# Patient Record
Sex: Male | Born: 1950 | Race: Black or African American | Hispanic: No | Marital: Married | Smoking: Never smoker
Health system: Southern US, Community
[De-identification: ages and names within clinical notes are randomized; demographics above are authoritative.]

## PROBLEM LIST (undated history)

## (undated) DIAGNOSIS — I10 Essential (primary) hypertension: Secondary | ICD-10-CM

## (undated) DIAGNOSIS — E119 Type 2 diabetes mellitus without complications: Secondary | ICD-10-CM

---

## 2016-11-26 ENCOUNTER — Encounter (HOSPITAL_BASED_OUTPATIENT_CLINIC_OR_DEPARTMENT_OTHER): Payer: Self-pay

## 2016-11-26 ENCOUNTER — Observation Stay (HOSPITAL_BASED_OUTPATIENT_CLINIC_OR_DEPARTMENT_OTHER)
Admission: EM | Admit: 2016-11-26 | Discharge: 2016-11-28 | Disposition: A | Discharge status: Home / Self Care | Payer: Self-pay | Attending: Internal Medicine | Admitting: Internal Medicine

## 2016-11-26 DIAGNOSIS — E785 Hyperlipidemia, unspecified: Secondary | ICD-10-CM | POA: Insufficient documentation

## 2016-11-26 DIAGNOSIS — E119 Type 2 diabetes mellitus without complications: Principal | ICD-10-CM | POA: Insufficient documentation

## 2016-11-26 DIAGNOSIS — G459 Transient cerebral ischemic attack, unspecified: Secondary | ICD-10-CM | POA: Diagnosis present

## 2016-11-26 DIAGNOSIS — E118 Type 2 diabetes mellitus with unspecified complications: Secondary | ICD-10-CM

## 2016-11-26 DIAGNOSIS — G450 Vertebro-basilar artery syndrome: Secondary | ICD-10-CM | POA: Insufficient documentation

## 2016-11-26 DIAGNOSIS — Z6824 Body mass index (BMI) 24.0-24.9, adult: Secondary | ICD-10-CM | POA: Insufficient documentation

## 2016-11-26 DIAGNOSIS — E663 Overweight: Secondary | ICD-10-CM | POA: Insufficient documentation

## 2016-11-26 DIAGNOSIS — R2689 Other abnormalities of gait and mobility: Secondary | ICD-10-CM | POA: Insufficient documentation

## 2016-11-26 DIAGNOSIS — I10 Essential (primary) hypertension: Secondary | ICD-10-CM | POA: Insufficient documentation

## 2016-11-26 HISTORY — DX: Type 2 diabetes mellitus without complications: E11.9

## 2016-11-26 HISTORY — DX: Essential (primary) hypertension: I10

## 2016-11-26 MED ORDER — ONDANSETRON 8 MG PO TBDP
8.0000 mg | ORAL_TABLET | Freq: Once | ORAL | Status: AC
  Administered 2016-11-26: 8 mg via ORAL
  Filled 2016-11-26: qty 1

## 2016-11-26 NOTE — ED Provider Notes (Signed)
[facility] Provider Note   CSN: [id number] Arrival date & time: 11/26/16  2044  By signing my name below, I, Vista Minkobert Ross, attest that this documentation has been prepared under the direction and in the presence of Zadie RhineWickline, Sharlyne Koeneman, MD. Electronically signed, Vista Minkobert Ross, ED Scribe. 11/26/16. 12:17 AM.  History   Chief Complaint Chief Complaint  Patient presents with  . Emesis    HPI HPI Comments:  Steven [name] is a 66 y.o. male, with Hx of DM, HTN, who presents to the Emergency Department with complaints of weakness and difficulty balancing that started this morning around 0500. This morning, pt started having difficulty balancing and seemed weak on his right side. Pt has not been able to walk normally since this morning. Per son: pt has had trouble balancing on his right side and had a near fall this evening due to this. Also per son, pt has had 3 episodes of vomiting today. Pt further reports an intermittent headache that is brought on during ambulation. He has not taken any medications prior to arrival. Pt has chronic weakness in his left leg but no other acute weakness.  Pt speaks [place]. A professional language interpreter was used.  The history is provided by the patient. A language interpreter was used [place]).    Past Medical History:  Diagnosis Date  . Diabetes mellitus without complication (HCC)   . Hypertension     There are no active problems to display for this patient.   History reviewed. No pertinent surgical history.   Home Medications    Prior to Admission medications   Not on File    Family History No family history on file.  Social History Social History  Substance Use Topics  . Smoking status: Never Smoker  . Smokeless tobacco: Never Used  . Alcohol use No    Allergies   Patient has no known allergies.   Review of Systems Review of Systems  Cardiovascular: Negative for chest pain.  Gastrointestinal: Positive for  vomiting.  Musculoskeletal: Positive for gait problem (difficulty balancing).  Neurological: Positive for dizziness and weakness (right sided). Negative for headaches.  All other systems reviewed and are negative.    Physical Exam Updated Vital Signs BP (!) 159/91 (BP Location: Left Arm)   Pulse 88   Temp 98.7 F (37.1 C) (Oral)   Resp 18   Ht 5\' 5"  (1.651 m)   Wt 149 lb 11.1 oz (67.9 kg)   SpO2 100%   BMI 24.91 kg/m   Physical Exam CONSTITUTIONAL: Well developed/well nourished HEAD: Normocephalic/atraumatic EYES: EOMI/PERRL, no nystagmus, no ptosis ENMT: Mucous membranes moist NECK: supple no meningeal signs, no bruits CV: S1/S2 noted, no murmurs/rubs/gallops noted LUNGS: Lungs are clear to auscultation bilaterally, no apparent distress ABDOMEN: soft, nontender, no rebound or guarding GU:no cva tenderness NEURO:Awake/alert, face symmetric, no arm or leg drift is noted Equal 5/5 strength with shoulder abduction, elbow flex/extension, wrist flex/extension in upper extremities  Equal 5/5 strength with hip flexion,knee flex/extension, foot dorsi/plantar flexion Gait normal without ataxia No past pointing EXTREMITIES: pulses normal, full ROM SKIN: warm, color normal PSYCH: no abnormalities of mood noted   ED Treatments / Results  DIAGNOSTIC STUDIES: Oxygen Saturation is 100% on RA, normal by my interpretation.  COORDINATION OF CARE: 12:09 AM-Discussed treatment plan with pt at bedside and pt agreed to plan.   Labs (all labs ordered are listed, but only abnormal results are displayed) Labs Reviewed  COMPREHENSIVE METABOLIC PANEL - Abnormal; Notable for the following:  Result Value   Chloride 100 (*)    Glucose, Bld 138 (*)    ALT 11 (*)    All other components within normal limits  CBC WITH DIFFERENTIAL/PLATELET - Abnormal; Notable for the following:    MCV 74.6 (*)    All other components within normal limits  URINALYSIS, ROUTINE W REFLEX MICROSCOPIC -  Abnormal; Notable for the following:    Glucose, UA 100 (*)    Protein, ur 30 (*)    All other components within normal limits  URINALYSIS, MICROSCOPIC (REFLEX) - Abnormal; Notable for the following:    Squamous Epithelial / LPF 0-5 (*)    All other components within normal limits  LIPASE, BLOOD  TROPONIN I  ETHANOL  PROTIME-INR  APTT  RAPID URINE DRUG SCREEN, HOSP PERFORMED  I-STAT CHEM 8, ED    EKG  EKG Interpretation  Date/Time:  Monday November 26 2016 23:24:07 EDT Ventricular Rate:  66 PR Interval:    QRS Duration: 87 QT Interval:  410 QTC Calculation: 430 R Axis:   23 Text Interpretation:  Sinus rhythm Nonspecific ST abnormality Abnormal ekg No previous ECGs available Confirmed by Zadie Rhine ([id number]) on 11/26/2016 11:31:52 PM       Radiology Ct Head Wo Contrast  Result Date: 11/27/2016 CLINICAL DATA:  Vomiting and weakness EXAM: CT HEAD WITHOUT CONTRAST TECHNIQUE: Contiguous axial images were obtained from the base of the skull through the vertex without intravenous contrast. COMPARISON:  None. FINDINGS: Brain: No acute territorial infarction, hemorrhage or intracranial mass is seen. Moderate-to-marked periventricular and subcortical white matter hypodensity most likely small vessel ischemic changes. Mild to moderate atrophy. Ventricles felt within normal limits given atrophy. 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 Vascular: No hyperdense vessels.  Carotid artery calcifications. Skull: No fracture or suspicious bone lesion Sinuses/Orbits: No acute fluid levels. Imaged portions of the orbits are unremarkable Other: None IMPRESSION: 1. No definite CT evidence for acute intracranial abnormality. 2. Moderate to marked small vessel ischemic changes of the white matter. Electronically Signed   By: Jasmine Pang M.D.   On: 11/27/2016 00:49    Procedures Procedures (including critical care time)  Medications Ordered in ED Medications    ondansetron (ZOFRAN-ODT) disintegrating tablet 8 mg (8 mg Oral Given 11/26/16 2336)     Initial Impression / Assessment and Plan / ED Course  I have reviewed the triage vital signs and the nursing notes.  Pertinent labs results that were available during my care of the patient were reviewed by me and considered in my medical decision making (see chart for details).    tPA in stroke considered but not given due to: Onset over 3-4.5hours  2:04 AM Pt here with episodes of HA/dizziness/vomiting and difficutly ambulating Concern for occult cerebellar infarct or VBI Will admit for TIA workup Pt agreeable D/w dr Clyde [facility]borg at [facility] for admission Defer neuro consult for now History is difficult due to language barrier  Final Clinical Impressions(s) / ED Diagnoses   Final diagnoses:  Vertebrobasilar artery syndrome    New Prescriptions New Prescriptions   No medications on file  I personally performed the services described in this documentation, which was scribed in my presence. The recorded information has been reviewed and is accurate.       Zadie Rhine, MD 11/27/16 [id number]

## 2016-11-26 NOTE — ED Notes (Addendum)
ED Provider at bedside. Phone interpreter used for the patients language of [place].

## 2016-11-26 NOTE — ED Triage Notes (Addendum)
Per son interpreter-pt was going from the BR to the bed approx 5am-when he had a near fall and vomited-with vomiting x 3 today-constipation x 3 days-NAD-presents to triage in w/c

## 2016-11-27 ENCOUNTER — Observation Stay (HOSPITAL_COMMUNITY): Payer: Self-pay

## 2016-11-27 ENCOUNTER — Emergency Department (HOSPITAL_BASED_OUTPATIENT_CLINIC_OR_DEPARTMENT_OTHER): Payer: Self-pay

## 2016-11-27 ENCOUNTER — Encounter (HOSPITAL_BASED_OUTPATIENT_CLINIC_OR_DEPARTMENT_OTHER): Payer: Self-pay | Admitting: *Deleted

## 2016-11-27 ENCOUNTER — Ambulatory Visit (HOSPITAL_BASED_OUTPATIENT_CLINIC_OR_DEPARTMENT_OTHER): Payer: Self-pay

## 2016-11-27 ENCOUNTER — Other Ambulatory Visit (HOSPITAL_COMMUNITY): Payer: Self-pay

## 2016-11-27 DIAGNOSIS — E118 Type 2 diabetes mellitus with unspecified complications: Secondary | ICD-10-CM

## 2016-11-27 DIAGNOSIS — G459 Transient cerebral ischemic attack, unspecified: Secondary | ICD-10-CM | POA: Diagnosis present

## 2016-11-27 DIAGNOSIS — I10 Essential (primary) hypertension: Secondary | ICD-10-CM

## 2016-11-27 DIAGNOSIS — E119 Type 2 diabetes mellitus without complications: Secondary | ICD-10-CM

## 2016-11-27 DIAGNOSIS — G450 Vertebro-basilar artery syndrome: Secondary | ICD-10-CM

## 2016-11-27 LAB — COMPREHENSIVE METABOLIC PANEL
ALT: 11 U/L — ABNORMAL LOW (ref 17–63)
AST: 16 U/L (ref 15–41)
Albumin: 4.6 g/dL (ref 3.5–5.0)
Alkaline Phosphatase: 67 U/L (ref 38–126)
Anion gap: 9 (ref 5–15)
BILIRUBIN TOTAL: 0.6 mg/dL (ref 0.3–1.2)
BUN: 16 mg/dL (ref 6–20)
CO2: 30 mmol/L (ref 22–32)
CREATININE: 0.99 mg/dL (ref 0.61–1.24)
Calcium: 9.8 mg/dL (ref 8.9–10.3)
Chloride: 100 mmol/L — ABNORMAL LOW (ref 101–111)
Glucose, Bld: 138 mg/dL — ABNORMAL HIGH (ref 65–99)
POTASSIUM: 3.7 mmol/L (ref 3.5–5.1)
Sodium: 139 mmol/L (ref 135–145)
TOTAL PROTEIN: 7.7 g/dL (ref 6.5–8.1)

## 2016-11-27 LAB — GLUCOSE, CAPILLARY
GLUCOSE-CAPILLARY: 138 mg/dL — AB (ref 65–99)
Glucose-Capillary: 110 mg/dL — ABNORMAL HIGH (ref 65–99)
Glucose-Capillary: 139 mg/dL — ABNORMAL HIGH (ref 65–99)
Glucose-Capillary: 205 mg/dL — ABNORMAL HIGH (ref 65–99)

## 2016-11-27 LAB — RAPID URINE DRUG SCREEN, HOSP PERFORMED
Amphetamines: NOT DETECTED
BARBITURATES: NOT DETECTED
BENZODIAZEPINES: NOT DETECTED
COCAINE: NOT DETECTED
Opiates: NOT DETECTED
TETRAHYDROCANNABINOL: NOT DETECTED

## 2016-11-27 LAB — URINALYSIS, ROUTINE W REFLEX MICROSCOPIC
Bilirubin Urine: NEGATIVE
GLUCOSE, UA: 100 mg/dL — AB
HGB URINE DIPSTICK: NEGATIVE
KETONES UR: NEGATIVE mg/dL
LEUKOCYTES UA: NEGATIVE
Nitrite: NEGATIVE
PROTEIN: 30 mg/dL — AB
Specific Gravity, Urine: 1.02 (ref 1.005–1.030)
pH: 6 (ref 5.0–8.0)

## 2016-11-27 LAB — CBC WITH DIFFERENTIAL/PLATELET
BASOS ABS: 0 10*3/uL (ref 0.0–0.1)
Basophils Relative: 0 %
EOS PCT: 0 %
Eosinophils Absolute: 0 10*3/uL (ref 0.0–0.7)
HEMATOCRIT: 39.7 % (ref 39.0–52.0)
HEMOGLOBIN: 13.9 g/dL (ref 13.0–17.0)
LYMPHS ABS: 1.5 10*3/uL (ref 0.7–4.0)
LYMPHS PCT: 18 %
MCH: 26.1 pg (ref 26.0–34.0)
MCHC: 35 g/dL (ref 30.0–36.0)
MCV: 74.6 fL — ABNORMAL LOW (ref 78.0–100.0)
MONOS PCT: 5 %
Monocytes Absolute: 0.4 10*3/uL (ref 0.1–1.0)
NEUTROS ABS: 6.5 10*3/uL (ref 1.7–7.7)
Neutrophils Relative %: 77 %
Platelets: 278 10*3/uL (ref 150–400)
RBC: 5.32 MIL/uL (ref 4.22–5.81)
RDW: 13.3 % (ref 11.5–15.5)
WBC: 8.4 10*3/uL (ref 4.0–10.5)

## 2016-11-27 LAB — ETHANOL: Alcohol, Ethyl (B): <5 mg/dL (ref <5)

## 2016-11-27 LAB — LIPASE, BLOOD: LIPASE: 30 U/L (ref 11–51)

## 2016-11-27 LAB — TROPONIN I: Troponin I: <0.03 ng/mL (ref <0.03)

## 2016-11-27 LAB — URINALYSIS, MICROSCOPIC (REFLEX): Bacteria, UA: NONE SEEN

## 2016-11-27 LAB — PROTIME-INR
INR: 0.98
Prothrombin Time: 13 seconds (ref 11.4–15.2)

## 2016-11-27 LAB — APTT: aPTT: 28 seconds (ref 24–36)

## 2016-11-27 MED ORDER — INSULIN ASPART 100 UNIT/ML ~~LOC~~ SOLN
0.0000 [IU] | Freq: Every day | SUBCUTANEOUS | Status: DC
  Administered 2016-11-27: 2 [IU] via SUBCUTANEOUS

## 2016-11-27 MED ORDER — ASPIRIN 325 MG PO TABS
325.0000 mg | ORAL_TABLET | Freq: Every day | ORAL | Status: DC
  Administered 2016-11-27 – 2016-11-28 (×2): 325 mg via ORAL
  Filled 2016-11-27 (×2): qty 1

## 2016-11-27 MED ORDER — ONDANSETRON HCL 4 MG/2ML IJ SOLN: 4.0000 mg | Freq: Three times a day (TID) | INTRAMUSCULAR | Status: DC | PRN

## 2016-11-27 MED ORDER — SODIUM CHLORIDE 0.9 % IV SOLN: INTRAVENOUS | Status: DC

## 2016-11-27 MED ORDER — ACETAMINOPHEN 650 MG RE SUPP: 650.0000 mg | RECTAL | Status: DC | PRN

## 2016-11-27 MED ORDER — HYDRALAZINE HCL 20 MG/ML IJ SOLN: 5.0000 mg | Freq: Three times a day (TID) | INTRAMUSCULAR | Status: DC | PRN

## 2016-11-27 MED ORDER — ACETAMINOPHEN 325 MG PO TABS: 650.0000 mg | ORAL_TABLET | ORAL | Status: DC | PRN

## 2016-11-27 MED ORDER — STROKE: EARLY STAGES OF RECOVERY BOOK
Freq: Once | Status: DC
  Filled 2016-11-27: qty 1

## 2016-11-27 MED ORDER — ENOXAPARIN SODIUM 40 MG/0.4ML ~~LOC~~ SOLN
40.0000 mg | SUBCUTANEOUS | Status: DC
  Administered 2016-11-27 – 2016-11-28 (×2): 40 mg via SUBCUTANEOUS
  Filled 2016-11-27 (×2): qty 0.4

## 2016-11-27 MED ORDER — ASPIRIN 300 MG RE SUPP
300.0000 mg | Freq: Every day | RECTAL | Status: DC
  Filled 2016-11-27: qty 1

## 2016-11-27 MED ORDER — INSULIN ASPART 100 UNIT/ML ~~LOC~~ SOLN
0.0000 [IU] | Freq: Three times a day (TID) | SUBCUTANEOUS | Status: DC
  Administered 2016-11-27 (×2): 1 [IU] via SUBCUTANEOUS
  Administered 2016-11-28: 2 [IU] via SUBCUTANEOUS

## 2016-11-27 MED ORDER — SENNOSIDES-DOCUSATE SODIUM 8.6-50 MG PO TABS: 1.0000 | ORAL_TABLET | Freq: Every evening | ORAL | Status: DC | PRN

## 2016-11-27 MED ORDER — ACETAMINOPHEN 160 MG/5ML PO SOLN: 650.0000 mg | ORAL | Status: DC | PRN

## 2016-11-27 NOTE — Progress Notes (Signed)
*  PRELIMINARY RESULTS* Vascular Ultrasound Carotid Duplex (Doppler) has been completed.   Findings suggest 1-39% internal carotid artery stenosis bilaterally. Vertebral arteries are patent with antegrade flow.  11/27/2016 11:56 AM Gertie FeyMichelle Ruford Dudzinski, BS, RVT, RDCS, RDMS

## 2016-11-27 NOTE — Evaluation (Signed)
Physical Therapy Evaluation Patient Details Name: Steven [name] MRN: [id number] DOB: 06-Aug-1950 Today's Date: 11/27/2016   History of Present Illness  66 year old male with past medical history of diabetes mellitus and hypertension, who presents with poor balance, dizziness, nearly fall, nausea, vomiting 3.  CT negative, MRI pending.  Clinical Impression  Pt is close to baseline functioning with pt noticing some L LE sluggishness, but mobility/gait is functional and pt should be safe at home with family assist. There are no further acute PT needs.  Will sign off at this time.     Follow Up Recommendations No PT follow up    Equipment Recommendations  None recommended by PT    Recommendations for Other Services       Precautions / Restrictions Precautions Precautions:  (minimal fall risk)      Mobility  Bed Mobility Overal bed mobility: Modified Independent                Transfers Overall transfer level: Needs assistance   Transfers: Sit to/from Stand Sit to Stand: Supervision            Ambulation/Gait Ambulation/Gait assistance: Supervision Ambulation Distance (Feet): 450 Feet Assistive device: None Gait Pattern/deviations: Step-through pattern   Gait velocity interpretation: at or above normal speed for age/gender General Gait Details: steady, mild circumduction L LE when finally fatigued, but nothing too destabilizing  Stairs Stairs: Yes Stairs assistance: Supervision Stair Management: One rail Left;Alternating pattern;Forwards Number of Stairs: 6 General stair comments: safe with rail  Wheelchair Mobility    Modified Rankin (Stroke Patients Only) Modified Rankin (Stroke Patients Only) Pre-Morbid Rankin Score: No symptoms Modified Rankin: No significant disability     Balance                                 Standardized Balance Assessment Standardized Balance Assessment : Dynamic Gait Index   Dynamic Gait Index Level  Surface: Normal Change in Gait Speed: Normal Gait with Horizontal Head Turns: Normal Gait with Vertical Head Turns: Normal Gait and Pivot Turn: Mild Impairment Step Over Obstacle: Mild Impairment Step Around Obstacles: Normal Steps: Mild Impairment Total Score: 21       Pertinent Vitals/Pain Pain Assessment: No/denies pain    Home Living Family/patient expects to be discharged to:: Private residence Living Arrangements: Spouse/significant other;Children Available Help at Discharge: Family;Available 24 hours/day Type of Home: House Home Access: Level entry     Home Layout: Two level;Bed/bath upstairs Home Equipment: None Additional Comments: Independent in the home    Prior Function Level of Independence: Independent               Hand Dominance        Extremity/Trunk Assessment   Upper Extremity Assessment Upper Extremity Assessment:  (no focal weakness)    Lower Extremity Assessment Lower Extremity Assessment: Overall WFL for tasks assessed       Communication   Communication: No difficulties  Cognition Arousal/Alertness: Awake/alert Behavior During Therapy: WFL for tasks assessed/performed Overall Cognitive Status: Within Functional Limits for tasks assessed                                        General Comments      Exercises     Assessment/Plan    PT Assessment Patent does not need any further PT services  PT  Problem List         PT Treatment Interventions      PT Goals (Current goals can be found in the Care Plan section)  Acute Rehab PT Goals PT Goal Formulation: All assessment and education complete, DC therapy    Frequency     Barriers to discharge        Co-evaluation               AM-PAC PT "6 Clicks" Daily Activity  Outcome Measure Difficulty turning over in bed (including adjusting bedclothes, sheets and blankets)?: None Difficulty moving from lying on back to sitting on the side of the bed? :  A Little Difficulty sitting down on and standing up from a chair with arms (e.g., wheelchair, bedside commode, etc,.)?: A Little Help needed moving to and from a bed to chair (including a wheelchair)?: A Little Help needed walking in hospital room?: A Little Help needed climbing 3-5 steps with a railing? : A Little 6 Click Score: 19    End of Session   Activity Tolerance: Patient tolerated treatment well Patient left: Other (comment) (in transport chair on way to MRI) Nurse Communication: Mobility status PT Visit Diagnosis: Unsteadiness on feet (R26.81)    Time: [id number] PT Time Calculation (min) (ACUTE ONLY): 25 min   Charges:   PT Evaluation $PT Eval Low Complexity: 1 Procedure PT Treatments $Gait Training: 8-22 mins   PT G Codes:   PT G-Codes **NOT FOR INPATIENT CLASS** Functional Assessment Tool Used: AM-PAC 6 Clicks Basic Mobility;Clinical judgement Functional Limitation: Mobility: Walking and moving around Mobility: Walking and Moving Around Current Status ([id number](G8978): 0 percent impaired, limited or restricted Mobility: Walking and Moving Around Goal Status (Q6578(G8979): 0 percent impaired, limited or restricted Mobility: Walking and Moving Around Discharge Status [phone](G8980): 0 percent impaired, limited or restricted    11/27/2016  Dalton BingKen Marqui Formby, PT [phone]  (pager)  Steven [name] 11/27/2016, 10:44 AM

## 2016-11-27 NOTE — Care Management Note (Signed)
Case Management Note  Patient Details  Name: Steven [name] MRN: [id number] Date of Birth: [birth year]  Subjective/Objective:    Pt in to r/o CVA. He is from [place] but currently living with family in [place].  Pt without insurance and PCP. Pt has seen Dr Mcarthur Rossetti a few times but patient struggles with cost of MD appointments.             Action/Plan: No f/u per PT/OT and no DME needs. CM met with the patient and his family and provided them information on the [facility]. They were interested. CM was able to obtain him an appointment at the [facility] clinic doing overflow for [facility]. CM informed the family about the use of the pharmacy at [facility] for assistance with his medications.   Expected Discharge Date:                  Expected Discharge Plan:  Home/Self Care  In-House Referral:     Discharge planning Services  CM Consult  Post Acute Care Choice:    Choice offered to:     DME Arranged:    DME Agency:     HH Arranged:    HH Agency:     Status of Service:  In process, will continue to follow  If discussed at Long Length of Stay Meetings, dates discussed:    Additional Comments:  Pollie Friar, RN 11/27/2016, 2:21 PM

## 2016-11-27 NOTE — Progress Notes (Addendum)
This is a no charge note  [facility] Bebe ShaggyWickline   66 year old male with past medical history of diabetes mellitus and hypertension, who presents with poor balance, dizziness, nearly fall, nausea, vomiting 3. Symptoms started at abut 5:00 AM. Per EDP, no obvious foacl neurologic findings on physical examination except abnormal finger to nose test. CT head is negative for acute intracranial abnormalities. The patient was found to have WBC 8.4, negative troponin, INR 0.98, PTT 28, electrolytes renal function okay, temperature normal, oxygen saturation 99% on room air. Pt is accepted to tele bed for obs. Per EDP, concerning for TIA, occult cerebellar infarct or VBI. TIA admission order was put in. Neuro was not called yet.  Pt speaks [place] language.  Please call manager or [facility] at [phone] when pt arrives to floor  Lorretta HarpXilin Shamanda Len, MD  [facility] Pager [phone]  If 7PM-7AM, please contact night-coverage [email or web address] Password [email or web address] 11/27/2016, 2:17 AM

## 2016-11-27 NOTE — Consult Note (Signed)
Requesting Physician: Dr. Evelena Peat    Chief Complaint: transient gait instability  History obtained from:  Patient, Wife and chart, interpreter was not available   HPI:                                                                                                                                         Steven [name] is an 66 y.o. male with hx as below is presenting with some questionable gait instability, leaning towards the right when walking. He apparently had a near fall, followed by some nausea and vomiting as well. Currently back to baseline with no complaints.    Past Medical History:  Diagnosis Date  . Diabetes mellitus without complication (HCC)   . Hypertension     History reviewed. No pertinent surgical history.  History reviewed. No pertinent family history. Social History:  reports that he has never smoked. He has never used smokeless tobacco. He reports that he does not drink alcohol or use drugs.  Allergies: No Known Allergies  Medications:                                                                                                                           I have reviewed the patient's current medications.  ROS:                                                                                                                                       History obtained from the patient  General ROS: negative for - chills, fatigue, fever, night sweats, weight gain or weight loss Psychological ROS: negative for - behavioral disorder, hallucinations, memory   Neurologic Examination:  Blood pressure 130/78, pulse 65, temperature 98.8 F (37.1 C), temperature source Oral, resp. rate 16, height 5\' 5"  (1.651 m), weight 67.9 kg (149 lb 9.6 oz), SpO2 100 %.  HEENT-  Normocephalic, no lesions, without obvious abnormality.  Normal external eye and conjunctiva.  Normal TM's  bilaterally.  Normal auditory canals and external ears. Normal external nose, mucus membranes and septum.  Normal pharynx. Cardiovascular- regular rate and rhythm, S1, S2 normal, no murmur, click, rub or gallop, pulses palpable throughout   Lungs- chest clear, no wheezing, rales, normal symmetric air entry, Heart exam - S1, S2 normal, no murmur, no gallop, rate regular Abdomen- soft, non-tender; bowel sounds normal; no masses,  no organomegaly Extremities- no edema and no cyanosis   Neurological Examination Mental Status: Alert, oriented, thought content appropriate.  Speech fluent without evidence of aphasia.  Able to follow 3 step commands without difficulty. Cranial Nerves: II: Discs flat bilaterally; Visual fields grossly normal,  III,IV, VI: ptosis not present, extra-ocular motions intact bilaterally, pupils equal, round, reactive to light and accommodation V,VII: smile symmetric, facial light touch sensation normal bilaterally VIII: hearing normal bilaterally IX,X: uvula rises symmetrically XI: bilateral shoulder shrug XII: midline tongue extension Motor: Right : Upper extremity   5/5    Left:     Upper extremity   5/5  Lower extremity   5/5     Lower extremity   5/5 Tone and bulk:normal tone throughout; no atrophy noted Sensory: Pinprick and light touch intact throughout, bilaterally Plantars: Right: downgoing   Left: downgoing Cerebellar: normal finger-to-nose, normal rapid alternating movements and normal heel-to-shin test Gait: normal gait and station, negative romberg        Lab Results: Basic Metabolic Panel:  Recent Labs Lab 11/26/16 2335  NA 139  K 3.7  CL 100*  CO2 30  GLUCOSE 138*  BUN 16  CREATININE 0.99  CALCIUM 9.8    Liver Function Tests:  Recent Labs Lab 11/26/16 2335  AST 16  ALT 11*  ALKPHOS 67  BILITOT 0.6  PROT 7.7  ALBUMIN 4.6    Recent Labs Lab 11/26/16 2335  LIPASE 30   No results for input(s): AMMONIA in the last 168  hours.  CBC:  Recent Labs Lab 11/26/16 2335  WBC 8.4  NEUTROABS 6.5  HGB 13.9  HCT 39.7  MCV 74.6*  PLT 278    Cardiac Enzymes:  Recent Labs Lab 11/26/16 2335  TROPONINI <0.03    Lipid Panel: No results for input(s): CHOL, TRIG, HDL, CHOLHDL, VLDL, LDLCALC in the last 168 hours.  CBG:  Recent Labs Lab 11/27/16 0622  GLUCAP 110*    Microbiology: No results found for this or any previous visit.  Coagulation Studies:  Recent Labs  11/27/16 0040  LABPROT 13.0  INR 0.98    Imaging: Ct Head Wo Contrast  Result Date: 11/27/2016 CLINICAL DATA:  Vomiting and weakness EXAM: CT HEAD WITHOUT CONTRAST TECHNIQUE: Contiguous axial images were obtained from the base of the skull through the vertex without intravenous contrast. COMPARISON:  None. FINDINGS: Brain: No acute territorial infarction, hemorrhage or intracranial mass is seen. Moderate-to-marked periventricular and subcortical white matter hypodensity most likely small vessel ischemic changes. Mild to moderate atrophy. Ventricles felt within normal limits given atrophy. 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 Vascular: No hyperdense vessels.  Carotid artery calcifications. Skull: No fracture or suspicious bone lesion Sinuses/Orbits: No acute fluid levels. Imaged portions of the orbits are unremarkable Other: None IMPRESSION: 1.  No definite CT evidence for acute intracranial abnormality. 2. Moderate to marked small vessel ischemic changes of the white matter. Electronically Signed   By: Jasmine Pang M.D.   On: 11/27/2016 00:49         11/27/2016, 9:28 AM   Assessment: 66 y.o. male with hx as below is presenting with some questionable gait instability, leaning towards the right when walking. He apparently had a near fall, followed by some nausea and vomiting as well. Currently back to baseline with no complaints.  Given his stroke/TIA risk factors would consider the below  workup  1. HgbA1c, fasting lipid panel 2. MRI of the brain without contrast, CTA head and neck with contrast 3. PT consult, OT consult, Speech consult 4. Echocardiogram 5. Carotid dopplers 6. Prophylactic therapy-Antiplatelet med: Aspirin - dose 325 7. Risk factor modification 8. Telemetry monitoring 9. Frequent neuro checks 10 NPO until passes stroke swallow screen 11 please page stroke NP  Or  PA  Or MD from 8am -4 pm  as this patient from this time will be  followed by the stroke.   You can look them up on [email or web address]  Password [email or web address]       Stroke Risk Factors - diabetes mellitus and hypertension

## 2016-11-27 NOTE — Evaluation (Addendum)
Occupational Therapy Evaluation/Discharge Patient Details Name: Steven [name] MRN: [id number] DOB: 03/05/1951 Today's Date: 11/27/2016    History of Present Illness 66 year old male with past medical history of diabetes mellitus and hypertension, who presents with poor balance, dizziness, nearly fall, nausea, vomiting 3.  CT negative, MRI pending.   Clinical Impression   Pt demonstrates ability to safely complete ADL in hospital setting with modified independence requiring increased time due to slight instability in dynamic standing but no physical assistance required. He demonstrates vision, cognition, and B UE strength WFL for ADL participation. Pt will have 24 hour assistance at home from wife. Pt/family report no further questions/concerns. No further acute OT needs identified and no OT follow-up recommended. OT will sign off.    Follow Up Recommendations  No OT follow up;Supervision/Assistance - 24 hour    Equipment Recommendations  None recommended by OT    Recommendations for Other Services       Precautions / Restrictions Precautions Precautions:  (minimal fall risk) Restrictions Weight Bearing Restrictions: No      Mobility Bed Mobility Overal bed mobility: Modified Independent                Transfers Overall transfer level: Modified independent   Transfers: Sit to/from Stand Sit to Stand: Supervision         General transfer comment: Increased time with no physical assistance.     Balance Overall balance assessment: Needs assistance Sitting-balance support: No upper extremity supported;Feet supported Sitting balance-Leahy Scale: Good     Standing balance support: No upper extremity supported;During functional activity Standing balance-Leahy Scale: Good Standing balance comment: Some instability with dynamic tasks but no assistance required                 Standardized Balance Assessment Standardized Balance Assessment : Dynamic Gait  Index   Dynamic Gait Index Level Surface: Normal Change in Gait Speed: Normal Gait with Horizontal Head Turns: Normal Gait with Vertical Head Turns: Normal Gait and Pivot Turn: Mild Impairment Step Over Obstacle: Mild Impairment Step Around Obstacles: Normal Steps: Mild Impairment Total Score: 21     ADL either performed or assessed with clinical judgement   ADL Overall ADL's : Modified independent                                       General ADL Comments: Increased time but no physical assistance.      Vision   Vision Assessment?: No apparent visual deficits Additional Comments: Pt able to functionally use vision approrpiately throughout session.      Perception     Praxis Praxis Praxis tested?: Within functional limits    Pertinent Vitals/Pain Pain Assessment: No/denies pain     Hand Dominance     Extremity/Trunk Assessment Upper Extremity Assessment Upper Extremity Assessment: Overall WFL for tasks assessed   Lower Extremity Assessment Lower Extremity Assessment: Overall WFL for tasks assessed       Communication Communication Communication: No difficulties   Cognition Arousal/Alertness: Awake/alert Behavior During Therapy: WFL for tasks assessed/performed Overall Cognitive Status: Within Functional Limits for tasks assessed                                 General Comments: Daughter-in-law reports some memory loss at baseline.    General Comments  Daughter-in-law and wife present and engaged in  session.     Exercises     Shoulder Instructions      Home Living Family/patient expects to be discharged to:: Private residence Living Arrangements: Spouse/significant other;Children Available Help at Discharge: Family;Available 24 hours/day Type of Home: House Home Access: Level entry     Home Layout: Two level;Bed/bath upstairs Alternate Level Stairs-Number of Steps: flight Alternate Level Stairs-Rails:  Right;Left Bathroom Shower/Tub: Chief Strategy Officer: Standard     Home Equipment: None   Additional Comments: Independent in the home      Prior Functioning/Environment Level of Independence: Independent                 OT Problem List: Decreased activity tolerance;Impaired balance (sitting and/or standing)      OT Treatment/Interventions:      OT Goals(Current goals can be found in the care plan section) Acute Rehab OT Goals Patient Stated Goal: to go home OT Goal Formulation: With patient/family Time For Goal Achievement: 12/11/16 Potential to Achieve Goals: Good  OT Frequency:     Barriers to D/C:            Co-evaluation              AM-PAC PT "6 Clicks" Daily Activity     Outcome Measure Help from another person eating meals?: None Help from another person taking care of personal grooming?: None Help from another person toileting, which includes using toliet, bedpan, or urinal?: None Help from another person bathing (including washing, rinsing, drying)?: None Help from another person to put on and taking off regular upper body clothing?: None Help from another person to put on and taking off regular lower body clothing?: None 6 Click Score: 24   End of Session    Activity Tolerance: Patient tolerated treatment well Patient left: in bed;with call bell/phone within reach;with family/visitor present  OT Visit Diagnosis: Other abnormalities of gait and mobility (R26.89)                Time: 1212-1228 OT Time Calculation (min): 16 min Charges:  OT General Charges $OT Visit: 1 Procedure OT Evaluation $OT Eval Low Complexity: 1 Procedure G-Codes: OT G-codes **NOT FOR INPATIENT CLASS** Functional Assessment Tool Used: AM-PAC 6 Clicks Daily Activity Functional Limitation: Self care Self Care Current Status (Z6109): 0 percent impaired, limited or restricted Self Care Goal Status (U0454): 0 percent impaired, limited or restricted Self  Care Discharge Status (U9811): 0 percent impaired, limited or restricted   Doristine Section, MS OTR/L  Pager: [phone]   Steven [name] Steven [name] 11/27/2016, 1:43 PM

## 2016-11-27 NOTE — ED Notes (Signed)
Patient transported to CT 

## 2016-11-27 NOTE — H&P (Signed)
History and Physical    Steven [name] ZOX:[id number] DOB: 14-Dec-1950 DOA: 11/26/2016   PCP: No primary care provider on file.   Patient coming from:  Home    Chief Complaint: Vomiting and gait instability   HPI: Steven [name] is a 66 y.o. male [place] speaker with medical history significant for DM, HTN, presenting with gait instability, "leaning to he right" with a near episode of fall around 5 am, nausea with 3 episodes of non bloody emesis without recurrence. Denied falls, seizures, confusion, vision changes. Denies fevers, chills, night sweats, insect bites, recent trips.  Reports intermittent frontal headache. Denies any respiratory complaints. Denies any chest pain or palpitations. Denies lower extremity swelling. No urine or bowel incontinence or retention,  Appetite is normal. No dysphagia.  Denies any dysuria. Denies abnormal skin rashes Denies any bleeding issues such as epistaxis, hematemesis, hematuria or hematochezia.  No ETOH, tobacco or recreational drug use  . ED Course:  BP 130/78 (BP Location: Right Arm)   Pulse 65   Temp 98.8 F (37.1 C) (Oral)   Resp 16   Ht 5\' 5"  (1.651 m)   Wt 67.9 kg (149 lb 9.6 oz)   SpO2 100%   BMI 24.89 kg/m    CT head negative WBC 8.4 TN neg INR 0.98 CMET normal  MRI  Head pending  EKG SR   Review of Systems:  As per HPI otherwise all other systems reviewed and are negative  Past Medical History:  Diagnosis Date  . Diabetes mellitus without complication (HCC)   . Hypertension     History reviewed. No pertinent surgical history.  Social History Social History   Social History  . Marital status: Married    Spouse name: N/A  . Number of children: N/A  . Years of education: N/A   Occupational History  . Not on file.   Social History Main Topics  . Smoking status: Never Smoker  . Smokeless tobacco: Never Used  . Alcohol use No  . Drug use: No  . Sexual activity: Not on file   Other Topics Concern  . Not on file    Social History Narrative  . No narrative on file     No Known Allergies  History reviewed. No pertinent family history.    Prior to Admission medications   Not on File    Physical Exam:  Vitals:   11/27/16 0345 11/27/16 0400 11/27/16 0503 11/27/16 0659  BP:  127/73 (!) 155/79 130/78  Pulse: 66 68 65   Resp:  17 16 16   Temp:   98.4 F (36.9 C) 98.8 F (37.1 C)  TempSrc:   Oral Oral  SpO2: 100% 100% 100%   Weight:   67.9 kg (149 lb 9.6 oz)   Height:   5\' 5"  (1.651 m)    Constitutional: NAD, calm, comfortable  Eyes: PERRL, lids and conjunctivae normal ENMT: Mucous membranes are moist, without exudate or lesions  Neck: normal, supple, no masses, no thyromegaly Respiratory: clear to auscultation bilaterally, no wheezing, no crackles. Normal respiratory effort  Cardiovascular: Regular rate and rhythm, no murmurs, rubs or gallops. No extremity edema. 2+ pedal pulses. No carotid bruits.  Abdomen: Soft, non tender, No hepatosplenomegaly. Bowel sounds positive.  Musculoskeletal: no clubbing / cyanosis. Moves all extremities Skin: no jaundice, No lesions.  Neurologic: Sensation intact  Strength equal in all extremities. Mild finger to nose R discordination. No  Nistagmus. Gait not tested but reports leaning to the right.  Psychiatric:   Alert  and oriented x 3. Normal mood.     Labs on Admission: I have personally reviewed following labs and imaging studies  CBC:  Recent Labs Lab 11/26/16 2335  WBC 8.4  NEUTROABS 6.5  HGB 13.9  HCT 39.7  MCV 74.6*  PLT 278    Basic Metabolic Panel:  Recent Labs Lab 11/26/16 2335  NA 139  K 3.7  CL 100*  CO2 30  GLUCOSE 138*  BUN 16  CREATININE 0.99  CALCIUM 9.8    GFR: Estimated Creatinine Clearance: 63.8 mL/min (by C-G formula based on SCr of 0.99 mg/dL).  Liver Function Tests:  Recent Labs Lab 11/26/16 2335  AST 16  ALT 11*  ALKPHOS 67  BILITOT 0.6  PROT 7.7  ALBUMIN 4.6    Recent Labs Lab  11/26/16 2335  LIPASE 30   No results for input(s): AMMONIA in the last 168 hours.  Coagulation Profile:  Recent Labs Lab 11/27/16 0040  INR 0.98    Cardiac Enzymes:  Recent Labs Lab 11/26/16 2335  TROPONINI <0.03    BNP (last 3 results) No results for input(s): PROBNP in the last 8760 hours.  HbA1C: No results for input(s): HGBA1C in the last 72 hours.  CBG:  Recent Labs Lab 11/27/16 0622  GLUCAP 110*    Lipid Profile: No results for input(s): CHOL, HDL, LDLCALC, TRIG, CHOLHDL, LDLDIRECT in the last 72 hours.  Thyroid Function Tests: No results for input(s): TSH, T4TOTAL, FREET4, T3FREE, THYROIDAB in the last 72 hours.  Anemia Panel: No results for input(s): VITAMINB12, FOLATE, FERRITIN, TIBC, IRON, RETICCTPCT in the last 72 hours.  Urine analysis:    Component Value Date/Time   COLORURINE YELLOW 11/27/2016 0122   APPEARANCEUR CLEAR 11/27/2016 0122   LABSPEC 1.020 11/27/2016 0122   PHURINE 6.0 11/27/2016 0122   GLUCOSEU 100 (A) 11/27/2016 0122   HGBUR NEGATIVE 11/27/2016 0122   BILIRUBINUR NEGATIVE 11/27/2016 0122   KETONESUR NEGATIVE 11/27/2016 0122   PROTEINUR 30 (A) 11/27/2016 0122   NITRITE NEGATIVE 11/27/2016 0122   LEUKOCYTESUR NEGATIVE 11/27/2016 0122    Sepsis Labs: @LABRCNTIP (procalcitonin:4,lacticidven:4) )No results found for this or any previous visit (from the past 240 hour(s)).   Radiological Exams on Admission: Ct Head Wo Contrast  Result Date: 11/27/2016 CLINICAL DATA:  Vomiting and weakness EXAM: CT HEAD WITHOUT CONTRAST TECHNIQUE: Contiguous axial images were obtained from the base of the skull through the vertex without intravenous contrast. COMPARISON:  None. FINDINGS: Brain: No acute territorial infarction, hemorrhage or intracranial mass is seen. Moderate-to-marked periventricular and subcortical white matter hypodensity most likely small vessel ischemic changes. Mild to moderate atrophy. Ventricles felt within normal limits  given atrophy. 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 Vascular: No hyperdense vessels.  Carotid artery calcifications. Skull: No fracture or suspicious bone lesion Sinuses/Orbits: No acute fluid levels. Imaged portions of the orbits are unremarkable Other: None IMPRESSION: 1. No definite CT evidence for acute intracranial abnormality. 2. Moderate to marked small vessel ischemic changes of the white matter. Electronically Signed   By: Jasmine Pang M.D.   On: 11/27/2016 00:49    EKG: Independently reviewed.  Assessment/Plan Active Problems:   TIA (transient ischemic attack)   Essential hypertension   Diabetes (HCC)   Stroke like symptoms,  Concerning for TIA versus migraine with aura versus vertigo vs occult cerebellar infarct.  History limited due to language barrier. CT head unremarkable. MRI ordered .CMET normal. Tn neg . TPA not given  Telemetry observation Stroke order  set Check MRI/MRA head pending Carotid dopplers pending 2D echo pending  Check lipid panel and A1C Tylenol for headaches Neurology consult Antiemetics prn    Hypertension BP 130/78  Pulse 65     Continue home anti-hypertensive medications until after MRI brain to allow permissive hypertension Hydralazine prn for BP 210/100  Type II Diabetes Current blood sugar level is 138 No results found for: HGBA1C Hgb A1C SSI Heart healthy carb modified diet.  DVT prophylaxis: Lovenox  Code Status:   Full    Family Communication:  Discussed with patient, language barrier, will request interpreter services  Disposition Plan: Expect patient to be discharged to home after condition improves Consults called:    Neuro; [place] interpreter prn Admission status:Tele  Obs     Marcos EkeWERTMAN,Ayyan Sites E, PA-C [facility]   11/27/2016, 7:45 AM

## 2016-11-27 NOTE — Progress Notes (Signed)
Pt off the floor at MRI during his 11 am vitals and neuro check.  Will continue to monitor.  Sondra ComeSilva, Recie Cirrincione M, RN

## 2016-11-28 ENCOUNTER — Observation Stay (HOSPITAL_BASED_OUTPATIENT_CLINIC_OR_DEPARTMENT_OTHER): Payer: Self-pay

## 2016-11-28 DIAGNOSIS — I6350 Cerebral infarction due to unspecified occlusion or stenosis of unspecified cerebral artery: Secondary | ICD-10-CM

## 2016-11-28 LAB — GLUCOSE, CAPILLARY
GLUCOSE-CAPILLARY: 102 mg/dL — AB (ref 65–99)
Glucose-Capillary: 157 mg/dL — ABNORMAL HIGH (ref 65–99)

## 2016-11-28 LAB — LIPID PANEL
CHOL/HDL RATIO: 4.5 ratio
Cholesterol: 185 mg/dL (ref 0–200)
HDL: 41 mg/dL (ref >40)
LDL CALC: 115 mg/dL — AB (ref 0–99)
Triglycerides: 143 mg/dL (ref <150)
VLDL: 29 mg/dL (ref 0–40)

## 2016-11-28 LAB — ECHOCARDIOGRAM COMPLETE
Height: 65 in
WEIGHTICAEL: 2393.6 [oz_av]

## 2016-11-28 MED ORDER — ATORVASTATIN CALCIUM 20 MG PO TABS: 20.0000 mg | ORAL_TABLET | Freq: Every day | ORAL | 0 refills | Status: DC

## 2016-11-28 MED ORDER — ASPIRIN EC 81 MG PO TBEC: 81.0000 mg | DELAYED_RELEASE_TABLET | Freq: Every day | ORAL | 0 refills | Status: DC

## 2016-11-28 MED ORDER — ATORVASTATIN CALCIUM 10 MG PO TABS: 20.0000 mg | ORAL_TABLET | Freq: Every day | ORAL | Status: DC

## 2016-11-28 NOTE — Progress Notes (Signed)
Discharge instructions reviewed with patient/family. All questions answered at this time. Transport home by family.   Markala Sitts, RN 

## 2016-11-28 NOTE — Progress Notes (Signed)
Initial Nutrition Assessment  DOCUMENTATION CODES:   Not applicable  INTERVENTION:   -If po intake inadequate on follow-up, recommend addition of nutritional supplement -Follow-up and provide diet education as needed  NUTRITION DIAGNOSIS:   Inadequate oral intake related to acute illness as evidenced by per patient/family report.  GOAL:   Patient will meet greater than or equal to 90% of their needs  MONITOR:   PO intake, Labs, Weight trends, Skin  REASON FOR ASSESSMENT:   Malnutrition Screening Tool    ASSESSMENT:    66 yo male admitted with stroke-like symptoms. Pt with hx of DM, HTN  CT head negative, MRI results pending  Recorded po intake 75% of meals, appetite good at present  Unable to complete Nutrition-Focused physical exam at this time.   Labs: reviewed Meds: reviewed  Diet Order:  Diet heart healthy/carb modified Room service appropriate? Yes; Fluid consistency: Thin  Skin:  Reviewed, no issues  Last BM:  no documented BM  Height:   Ht Readings from Last 1 Encounters:  11/27/16 5\' 5"  (1.651 m)    Weight:   Wt Readings from Last 1 Encounters:  11/27/16 149 lb 9.6 oz (67.9 kg)   BMI:  Body mass index is 24.89 kg/m.  Estimated Nutritional Needs:   Kcal:  1700-2040 kcals  Protein:  85-102 g   Fluid:  >/= 2 L  EDUCATION NEEDS:   No education needs identified at this time  Romelle StarcherCate Samel Bruna MS, RD, LDN [phone] Pager  [phone] Weekend/On-Call Pager

## 2016-11-28 NOTE — Progress Notes (Signed)
STROKE TEAM PROGRESS NOTE   HISTORY OF PRESENT ILLNESS (per record) Steven [name] is a 66 y.o. male Steven [name] speaker with medical history significant for diabetes mellitus and hypertension presenting with gait instability, "leaning to he right" with a near episode of fall around 5 am, nausea with 3 episodes of emesis.  The patient's family reports that he has had similar episodes of gait instability before in Steven [name], but has no prior history of stroke.  Patient was back to baseline when examined by the neurohospitalist on 11/27/2016.  No stroke on imaging.  Incidental small 4mm R ICA aneurysm.  Patient was not administered IV t-PA secondary to resolution of symptoms on arrival. He was admitted to General Neurology for further evaluation and treatment.   SUBJECTIVE (INTERVAL HISTORY) His family is at the bedside.  The patient is alert and follows commands appropriately.He speaks limited English and daughter translates   OBJECTIVE Temp:  [98 F (36.7 C)-98.8 F (37.1 C)] 98.4 F (36.9 C) (06/27 0444) Pulse Rate:  [64-83] 78 (06/27 0444) Cardiac Rhythm: Normal sinus rhythm (06/26 1900) Resp:  [16-20] 18 (06/27 0444) BP: (119-140)/(60-86) 119/64 (06/27 0444) SpO2:  [98 %-100 %] 98 % (06/27 0444)  CBC:  Recent Labs Lab 11/26/16 2335  WBC 8.4  NEUTROABS 6.5  HGB 13.9  HCT 39.7  MCV 74.6*  PLT 278    Basic Metabolic Panel:  Recent Labs Lab 11/26/16 2335  NA 139  K 3.7  CL 100*  CO2 30  GLUCOSE 138*  BUN 16  CREATININE 0.99  CALCIUM 9.8    Lipid Panel:    Component Value Date/Time   CHOL 185 11/28/2016 0507   TRIG 143 11/28/2016 0507   HDL 41 11/28/2016 0507   CHOLHDL 4.5 11/28/2016 0507   VLDL 29 11/28/2016 0507   LDLCALC 115 (H) 11/28/2016 0507   HgbA1c: No results found for: HGBA1C Urine Drug Screen:    Component Value Date/Time   LABOPIA NONE DETECTED 11/27/2016 0122   COCAINSCRNUR NONE DETECTED 11/27/2016 0122   LABBENZ NONE DETECTED 11/27/2016 0122   AMPHETMU NONE DETECTED 11/27/2016 0122   THCU NONE DETECTED 11/27/2016 0122   LABBARB NONE DETECTED 11/27/2016 0122    Alcohol Level     Component Value Date/Time   ETH <5 11/27/2016 0040    IMAGING  Dg Chest 1 View 11/27/2016 IMPRESSION: No active disease.   Ct Head Wo Contrast 11/27/2016 IMPRESSION: 1. No definite CT evidence for acute intracranial abnormality. 2. Moderate to marked small vessel ischemic changes of the white matter.  Mr Brain 26Wo Contrast Mr Steven GlennMra Head/brain Wo Cm 11/27/2016 MRI HEAD IMPRESSION: 1. No acute intracranial infarct or other process identified. 2. Moderate cerebral atrophy with advanced chronic microvascular ischemic disease. 3. Few scattered subcentimeter chronic micro hemorrhages involving the supratentorial and infratentorial brain, most likely related to chronic underlying hypertension.  MRA HEAD IMPRESSION: 1. Negative intracranial MRA. No large or proximal arterial branch occlusion. No high-grade or correctable stenosis. No significant atheromatous changes for patient age. 2. 4 mm focal outpouching arising from the cavernous left ICA, suspicious for small aneurysm.       PHYSICAL EXAM Middle aged african origin male not in distress. . Afebrile. Head is nontraumatic. Neck is supple without bruit.    Cardiac exam no murmur or gallop. Lungs are clear to auscultation. Distal pulses are well felt. Neurological Exam ;  Awake  Alert oriented x 3. Normal speech and language.eye movements full without nystagmus.fundi were not visualized. Vision acuity and fields  appear normal. Hearing is normal. Palatal movements are normal. Face symmetric. Tongue midline. Normal strength, tone, reflexes and coordination. Normal sensation. Gait deferred.  ASSESSMENT/PLAN Mr. Steven [name] is a 66 y.o. male with history of diabetes mellitus and hypertension presenting with gait instability, "leaning to he right" with a near episode of fall around 5 am, nausea with 3  episodes of emesis.He did not receive IV t-PA due to resolution of symptoms on arrival.   TIA: etiology unknown  But likely vertebrobasilar disease in setting of moderate to marked small vessel ischemic changes of the white matter  Resultant   No deficits  CT head: no stroke  MRI head: no stroke  MRA head: no stroke, incidental finding of 4 mm focal outpouching arising from the cavernous left ICA, suspicious for small aneurysm.  Carotid Doppler  1-39% internal carotid artery stenosis bilaterally. Vertebral arteries are patent with antegrade flow. 2D Echo  Left ventricle: The cavity size was normal. Wall thickness was   increased in a pattern of mild LVH. Systolic function was normal.   The estimated ejection fraction was in the range of 60% to 65%.   Wall motion was normal; there were no regional wall motion   abnormalities.   LDL 115  HgbA1c pending   Lovenox 40 mg sq daily for VTE prophylaxis  Diet heart healthy/carb modified Room service appropriate? Yes; Fluid consistency: Thin  On no antiplatelets prior to admission, now on Aspirin 81 mg   Patient counseled to be compliant with his antithrombotic medications  Ongoing aggressive stroke risk factor management  Therapy recommendations:  none  Disposition: Discharge to home/self-care  Hypertension  Stable  Permissive hypertension (OK if < 220/120) but gradually normalize in 5-7 days  Long-term BP goal normotensive  Hyperlipidemia  Home meds: atorvastatin 20 mg, resumed in hospital  LDL 115, goal < 70  Continue statin at discharge  Diabetes  HgbA1c pending , goal < 7.0  Controlled  Other Stroke Risk Factors  Advanced age  Overweight, Body mass index is 24.89 kg/m., recommend weight loss, diet and exercise as appropriate   Other Active Problems  none  Hospital day # 0  I have personally examined this patient, reviewed notes, independently viewed imaging studies, participated in medical decision  making and plan of care.ROS completed by me personally and pertinent positives fully documented  I have made any additions or clarifications directly to the above note. He presented with transient episode of gait ataxia with nausea and vomiting possibly her posterior circulation TIA. Recommend aspirin and finish ongoing stroke workup revealed long discussion with patient and multiple family members and Dr. Florene Route. Greater than 50% time during this 25 minute visit was spent on counseling and coordination of care about his TIA, stroke risk prevention and treatment discussion.  Delia Heady, MD Medical Director [facility] Pager: [phone] 11/28/2016 6:30 PM   To contact Stroke Continuity provider, please refer to [email or web address]. After hours, contact General Neurology

## 2016-11-28 NOTE — Care Management Note (Signed)
Case Management Note  Patient Details  Name: Mal MistySamuel Schemm MRN: [id number] Date of Birth: 10/20/1950  Subjective/Objective:                    Action/Plan: Pt discharging home with family. Pt set up with [facility]. CM [facility]. CM also provided them a coupon for the lipitor in case [facility]  Family to provide transportation home.  Expected Discharge Date:  11/28/16               Expected Discharge Plan:  Home/Self Care  In-House Referral:     Discharge planning Services  CM Consult  Post Acute Care Choice:    Choice offered to:     DME Arranged:    DME Agency:     HH Arranged:    HH Agency:     Status of Service:  Completed, signed off  If discussed at [employer] Length of Stay Meetings, dates discussed:    Additional Comments:  Kermit BaloKelli F Tytianna Greenley, RN 11/28/2016, 4:15 PM

## 2016-11-28 NOTE — Discharge Instructions (Signed)
Follow with Primary MD  in 7 days  ° °Get CBC, CMP,  checked  by Primary MD next visit.  ° ° °Activity: As tolerated with Full fall precautions use walker/cane & assistance as needed ° ° °Disposition Home  ° ° °Diet: Heart Healthy , carbohydrate modified, with feeding assistance and aspiration precautions. ° ° ° °On your next visit with your primary care physician please Get Medicines reviewed and adjusted. ° ° °Please request your Prim.MD to go over all Hospital Tests and Procedure/Radiological results at the follow up, please get all Hospital records sent to your Prim MD by signing hospital release before you go home. ° ° °If you experience worsening of your admission symptoms, develop shortness of breath, life threatening emergency, suicidal or homicidal thoughts you must seek medical attention immediately by calling 911 or calling your MD immediately  if symptoms less severe. ° °You Must read complete instructions/literature along with all the possible adverse reactions/side effects for all the Medicines you take and that have been prescribed to you. Take any new Medicines after you have completely understood and accpet all the possible adverse reactions/side effects.  ° °Do not drive, operating heavy machinery, perform activities at heights, swimming or participation in water activities or provide baby sitting services if your were admitted for syncope or siezures until you have seen by Primary MD or a Neurologist and advised to do so again. ° °Do not drive when taking Pain medications.  ° ° °Do not take more than prescribed Pain, Sleep and Anxiety Medications ° °Special Instructions: If you have smoked or chewed Tobacco  in the last 2 yrs please stop smoking, stop any regular Alcohol  and or any Recreational drug use. ° °Wear Seat belts while driving. ° ° °Please note ° °You were cared for by a hospitalist during your hospital stay. If you have any questions about your discharge medications or the care you  received while you were in the hospital after you are discharged, you can call the unit and asked to speak with the hospitalist on call if the hospitalist that took care of you is not available. Once you are discharged, your primary care physician will handle any further medical issues. Please note that NO REFILLS for any discharge medications will be authorized once you are discharged, as it is imperative that you return to your primary care physician (or establish a relationship with a primary care physician if you do not have one) for your aftercare needs so that they can reassess your need for medications and monitor your lab values. ° °

## 2016-11-28 NOTE — Progress Notes (Signed)
SLP Cancellation Note  Patient Details Name: Steven [name] MRN: [id number] DOB: 06/22/1950   Cancelled treatment:       Reason Eval/Treat Not Completed: SLP screened, no needs identified, will sign off. Pt preparing to d/c. Spoke with pt and family who report that all symptoms have resolved and pt back to baseline. SLP will sign off; please re-consult if needs arise.   Metro KungAmy K Leanard Dimaio, MA, CCC-SLP 11/28/2016, 3:25 PM  [phone]

## 2016-11-28 NOTE — Progress Notes (Signed)
  Echocardiogram 2D Echocardiogram has been performed.  Steven [name] Steven [name] 11/28/2016, 1:48 PM

## 2016-11-28 NOTE — Discharge Summary (Signed)
Lathaniel Legate, is a 66 y.o. male  DOB 04-May-1951  MRN [id number].  Admission date:  11/26/2016  Admitting Physician  Ozella Rocks, MD  Discharge Date:  11/28/2016   Primary MD  No primary care provider on file.  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 Diabetes mellitus without complication (HCC)   . Hypertension     History reviewed. No pertinent surgical history.     History of present illness and  Hospital Course:     Kindly see H&P for history of present illness and admission details, please review complete Labs, Consult reports and Test reports for all details in brief  HPI  from the history and physical done on the day of admission 11/27/2016  HPI: Susana Gripp is a 66 y.o. male [place] speaker with medical history significant for DM, HTN, presenting with gait instability, "leaning to he right" with a near episode of fall around 5 am, nausea with 3 episodes of non bloody emesis without recurrence. Denied falls, seizures, confusion, vision changes. Denies fevers, chills, night sweats, insect bites, recent trips.  Reports intermittent frontal headache. Denies any respiratory complaints. Denies any chest pain or palpitations. Denies lower extremity swelling. No urine or bowel incontinence or retention,  Appetite is normal. No dysphagia.  Denies any dysuria. Denies abnormal skin rashes Denies any bleeding issues such as epistaxis, hematemesis, hematuria or  hematochezia.  No ETOH, tobacco or recreational drug use  . ED Course:  BP 130/78 (BP Location: Right Arm)   Pulse 65   Temp 98.8 F (37.1 C) (Oral)   Resp 16   Ht 5\' 5"  (1.651 m)   Wt 67.9 kg (149 lb 9.6 oz)   SpO2 100%   BMI 24.89 kg/m    CT head negative WBC 8.4 TN neg INR 0.98 CMET normal  MRI  Head pending  EKG SR   Hospital Course   TIA - Neurology consult appreciated, patient symptoms concerning for TIA , CT head unremarkable, MRI brain with no evidence of acute CVA, MRA head with no significant large occlusion or high-grade stenosis , only significant for 4 mm focal left ICA aneurysm , or acute Doppler with no significant stenosis, 2-D echo with no evidence of embolic source, patient with elevated LDL at 1:15, discussed with neurology, recommendation for discharge and aspirin 81 mg oral daily, and  Lipitor for his hyperlipidemia.  Hypertension - Continue home medication on discharge  Hyperlipidemia - LDL is 115, started on Lipitor  Type 2 diabetes - Continue with metformin - Follow on A1c  Discharge Condition:  Stable  Follow UP  Follow-up Information    [facility] Follow up on 12/03/2016.   Why:  Your appointment time is 2pm. Please arrive 15 min early. Please bring picture ID and current medications. Contact information: [address] [place] [phone]            Discharge Instructions  and  Discharge Medications    Discharge Instructions    Discharge instructions    Complete by:  As directed    Follow with Primary MD in 7 days   Get CBC, CMP,  checked  by Primary MD next visit.    Activity: As tolerated with Full fall precautions use walker/cane & assistance as needed   Disposition Home    Diet: Heart Healthy , carbohydrate modified , with feeding assistance and aspiration precautions.   On your next visit with your primary care physician please Get Medicines reviewed and adjusted.   Please  request your Prim.MD to go over all Hospital Tests and Procedure/Radiological results at the follow up, please get all Hospital records sent to your Prim MD by signing hospital release before you go home.   If you experience worsening of your admission symptoms, develop shortness of breath, life threatening emergency, suicidal or homicidal thoughts you must seek medical attention immediately by calling 911 or calling your MD immediately  if symptoms less severe.  You Must read complete instructions/literature along with all the possible adverse reactions/side effects for all the Medicines you take and that have been prescribed to you. Take any new Medicines after you have completely understood and accpet all the possible adverse reactions/side effects.   Do not drive, operating heavy machinery, perform activities at heights, swimming or participation in water activities or provide baby sitting services if your were admitted for syncope or siezures until you have seen by Primary MD or a Neurologist and advised to do so again.  Do not drive when taking Pain medications.    Do not take more than prescribed Pain, Sleep and Anxiety Medications  Special Instructions: If you have smoked or chewed Tobacco  in the last 2 yrs please stop smoking, stop any regular Alcohol  and or any Recreational drug use.  Wear Seat belts while driving.   Please note  You were cared for by a hospitalist during your hospital stay. If you have any questions about your discharge medications or the care you received while you were in the hospital after you are discharged, you can call the unit and asked to speak with the hospitalist on call if the hospitalist that took care of you is not available. Once you are discharged, your primary care physician will handle any further medical issues. Please note that NO REFILLS for any discharge medications will be authorized once you are discharged, as it is imperative that you return  to your primary care physician (or establish a relationship with a primary care physician if you do not have one) for your aftercare needs so that they can reassess your need for medications and monitor your lab values.   Increase activity slowly    Complete by:  As directed      Allergies as of 11/28/2016   No Known Allergies     Medication List    TAKE these medications   acetaminophen 500 MG tablet Commonly known as:  TYLENOL Take 1,000 mg by mouth every 6 (six) hours as needed for moderate pain or headache.   aspirin EC 81 MG  tablet Take 1 tablet (81 mg total) by mouth daily.   atorvastatin 20 MG tablet Commonly known as:  LIPITOR Take 1 tablet (20 mg total) by mouth daily at 6 PM.   glipiZIDE 5 MG tablet Commonly known as:  GLUCOTROL Take 5 mg by mouth daily before breakfast.   losartan 100 MG tablet Commonly known as:  COZAAR Take 100 mg by mouth daily.   metFORMIN 1000 MG tablet Commonly known as:  GLUCOPHAGE Take 1,000 mg by mouth 2 (two) times daily with a meal.         Diet and Activity recommendation: See Discharge Instructions above   Consults obtained - Neurology   Major procedures and Radiology Reports - PLEASE review detailed and final reports for all details, in brief -     Dg Chest 1 View  Result Date: 11/27/2016 CLINICAL DATA:  Left sided chest pain. EXAM: CHEST 1 VIEW COMPARISON:  None. FINDINGS: Lung markings are slightly coarse and probably represent chronic changes. No significant airspace disease or pulmonary edema. Heart and mediastinum are within normal limits. Trachea is midline. Normal bowel gas pattern in the upper abdomen. There may be an ECG skin sticker overlying the left upper chest. No acute bone abnormality. IMPRESSION: No active disease. Electronically Signed   By: Richarda Overlie M.D.   On: 11/27/2016 12:08   Ct Head Wo Contrast  Result Date: 11/27/2016 CLINICAL DATA:  Vomiting and weakness EXAM: CT HEAD WITHOUT CONTRAST  TECHNIQUE: Contiguous axial images were obtained from the base of the skull through the vertex without intravenous contrast. COMPARISON:  None. FINDINGS: Brain: No acute territorial infarction, hemorrhage or intracranial mass is seen. Moderate-to-marked periventricular and subcortical white matter hypodensity most likely small vessel ischemic changes. Mild to moderate atrophy. Ventricles felt within normal limits given atrophy. 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 Vascular: No hyperdense vessels.  Carotid artery calcifications. Skull: No fracture or suspicious bone lesion Sinuses/Orbits: No acute fluid levels. Imaged portions of the orbits are unremarkable Other: None IMPRESSION: 1. No definite CT evidence for acute intracranial abnormality. 2. Moderate to marked small vessel ischemic changes of the white matter. Electronically Signed   By: Jasmine Pang M.D.   On: 11/27/2016 00:49   Mr Brain Wo Contrast  Result Date: 11/27/2016 CLINICAL DATA:  Initial evaluation for for balance, dizziness, nausea, vomiting. EXAM: MRI HEAD WITHOUT CONTRAST MRA HEAD WITHOUT CONTRAST TECHNIQUE: Multiplanar, multiecho pulse sequences of the brain and surrounding structures were obtained without intravenous contrast. Angiographic images of the head were obtained using MRA technique without contrast. COMPARISON:  Comparison made with prior CT from earlier the same day. FINDINGS: MRI HEAD FINDINGS Brain: Diffuse prominence of the CSF containing spaces is compatible with generalized cerebral atrophy, moderate nature. Patchy and confluent T2/FLAIR hyperintensity within the periventricular and deep white matter both cerebral hemispheres most consistent with chronic small vessel ischemic disease, fairly advanced in nature. Scattered superimposed remote lacunar infarcts present within the bilateral basal ganglia and thalami as well as the pons. No abnormal foci of restricted diffusion to suggest  acute or subacute ischemia. Gray-white matter differentiation maintained. No other areas of remote cortical infarction. No evidence for acute intracranial hemorrhage. Few scattered subcentimeter foci of susceptibility artifact seen throughout the supratentorial and infratentorial brain, most consistent with small chronic micro hemorrhages, most likely hypertensive in etiology. No mass lesion, midline shift or mass effect. Diffuse ventricular prominence related to global parenchymal volume loss without hydrocephalus. No extra-axial fluid collection. Major  dural sinuses are grossly patent. Pituitary gland suprasellar region within normal limits. Midline structures intact and normal. Vascular: Major intracranial vascular flow voids are maintained. Skull and upper cervical spine: Craniocervical junction within normal limits. Visualized upper cervical spine unremarkable. Bone marrow signal intensity within normal limits. No scalp soft tissue abnormality. Sinuses/Orbits: Globes and orbital soft tissues within normal limits. Paranasal sinuses are largely clear. No mastoid effusion. Inner ear structures normal. MRA HEAD FINDINGS ANTERIOR CIRCULATION: Distal cervical segments of the internal carotid arteries are widely patent with antegrade flow. Petrous, cavernous, and supraclinoid segments widely patent bilaterally without flow-limiting stenosis. ICA termini widely patent. 4 mm focal outpouching arising from the supraclinoid left ICA, suspicious for possible small aneurysm (series 8, image 85). This is directed medially and slightly posteriorly. A1 segments widely patent. Anterior communicating artery normal. Anterior cerebral artery is widely patent to their distal aspects. M1 segments widely patent without stenosis or occlusion. MCA bifurcations normal. No proximal M2 occlusion. Distal MCA branches well opacified and symmetric. POSTERIOR CIRCULATION: Vertebral artery is widely patent to the vertebrobasilar junction. Left  vertebral artery dominant. Basilar artery widely patent to its distal aspect. Superior cerebral arteries patent bilaterally. Both of the posterior cerebral arteries primarily supplied via the basilar artery and are widely patent to their distal aspects. Small bilateral posterior communicating arteries noted. IMPRESSION: MRI HEAD IMPRESSION: 1. No acute intracranial infarct or other process identified. 2. Moderate cerebral atrophy with advanced chronic microvascular ischemic disease. 3. Few scattered subcentimeter chronic micro hemorrhages involving the supratentorial and infratentorial brain, most likely related to chronic underlying hypertension. MRA HEAD IMPRESSION: 1. Negative intracranial MRA. No large or proximal arterial branch occlusion. No high-grade or correctable stenosis. No significant atheromatous changes for patient age. 2. 4 mm focal outpouching arising from the cavernous left ICA, suspicious for small aneurysm. Electronically Signed   By: Rise Mu M.D.   On: 11/27/2016 15:00   Mr Maxine Glenn Head/brain ZO Cm  Result Date: 11/27/2016 CLINICAL DATA:  Initial evaluation for for balance, dizziness, nausea, vomiting. EXAM: MRI HEAD WITHOUT CONTRAST MRA HEAD WITHOUT CONTRAST TECHNIQUE: Multiplanar, multiecho pulse sequences of the brain and surrounding structures were obtained without intravenous contrast. Angiographic images of the head were obtained using MRA technique without contrast. COMPARISON:  Comparison made with prior CT from earlier the same day. FINDINGS: MRI HEAD FINDINGS Brain: Diffuse prominence of the CSF containing spaces is compatible with generalized cerebral atrophy, moderate nature. Patchy and confluent T2/FLAIR hyperintensity within the periventricular and deep white matter both cerebral hemispheres most consistent with chronic small vessel ischemic disease, fairly advanced in nature. Scattered superimposed remote lacunar infarcts present within the bilateral basal ganglia and  thalami as well as the pons. No abnormal foci of restricted diffusion to suggest acute or subacute ischemia. Gray-white matter differentiation maintained. No other areas of remote cortical infarction. No evidence for acute intracranial hemorrhage. Few scattered subcentimeter foci of susceptibility artifact seen throughout the supratentorial and infratentorial brain, most consistent with small chronic micro hemorrhages, most likely hypertensive in etiology. No mass lesion, midline shift or mass effect. Diffuse ventricular prominence related to global parenchymal volume loss without hydrocephalus. No extra-axial fluid collection. Major dural sinuses are grossly patent. Pituitary gland suprasellar region within normal limits. Midline structures intact and normal. Vascular: Major intracranial vascular flow voids are maintained. Skull and upper cervical spine: Craniocervical junction within normal limits. Visualized upper cervical spine unremarkable. Bone marrow signal intensity within normal limits. No scalp soft tissue abnormality. Sinuses/Orbits: Globes and orbital soft tissues  within normal limits. Paranasal sinuses are largely clear. No mastoid effusion. Inner ear structures normal. MRA HEAD FINDINGS ANTERIOR CIRCULATION: Distal cervical segments of the internal carotid arteries are widely patent with antegrade flow. Petrous, cavernous, and supraclinoid segments widely patent bilaterally without flow-limiting stenosis. ICA termini widely patent. 4 mm focal outpouching arising from the supraclinoid left ICA, suspicious for possible small aneurysm (series 8, image 85). This is directed medially and slightly posteriorly. A1 segments widely patent. Anterior communicating artery normal. Anterior cerebral artery is widely patent to their distal aspects. M1 segments widely patent without stenosis or occlusion. MCA bifurcations normal. No proximal M2 occlusion. Distal MCA branches well opacified and symmetric. POSTERIOR  CIRCULATION: Vertebral artery is widely patent to the vertebrobasilar junction. Left vertebral artery dominant. Basilar artery widely patent to its distal aspect. Superior cerebral arteries patent bilaterally. Both of the posterior cerebral arteries primarily supplied via the basilar artery and are widely patent to their distal aspects. Small bilateral posterior communicating arteries noted. IMPRESSION: MRI HEAD IMPRESSION: 1. No acute intracranial infarct or other process identified. 2. Moderate cerebral atrophy with advanced chronic microvascular ischemic disease. 3. Few scattered subcentimeter chronic micro hemorrhages involving the supratentorial and infratentorial brain, most likely related to chronic underlying hypertension. MRA HEAD IMPRESSION: 1. Negative intracranial MRA. No large or proximal arterial branch occlusion. No high-grade or correctable stenosis. No significant atheromatous changes for patient age. 2. 4 mm focal outpouching arising from the cavernous left ICA, suspicious for small aneurysm. Electronically Signed   By: Rise Mu M.D.   On: 11/27/2016 15:00    Micro Results    No results found for this or any previous visit (from the past 240 hour(s)).     Today   Subjective:   Deaven Urwin today has no headache,no chest pain,no new weakness tingling or numbness, feels much better wants to go home today.  Objective:   Blood pressure 130/74, pulse 83, temperature 98.2 F (36.8 C), temperature source Oral, resp. rate 16, height 5\' 5"  (1.651 m), weight 67.9 kg (149 lb 9.6 oz), SpO2 100 %.   Intake/Output Summary (Last 24 hours) at 11/28/16 1525 Last data filed at 11/28/16 1300  Gross per 24 hour  Intake              600 ml  Output                0 ml  Net              600 ml    Exam Awake Alert, Oriented x 3, No new F.N deficits, Normal affect Symmetrical Chest wall movement, Good air movement bilaterally, CTAB RRR,No Gallops,Rubs or new Murmurs, No  Parasternal Heave +ve B.Sounds, Abd Soft, Non tender, No rebound -guarding or rigidity. No Cyanosis, Clubbing or edema, No new Rash or bruise  Data Review   CBC w Diff: Lab Results  Component Value Date   WBC 8.4 11/26/2016   HGB 13.9 11/26/2016   HCT 39.7 11/26/2016   PLT 278 11/26/2016   LYMPHOPCT 18 11/26/2016   MONOPCT 5 11/26/2016   EOSPCT 0 11/26/2016   BASOPCT 0 11/26/2016    CMP: Lab Results  Component Value Date   NA 139 11/26/2016   K 3.7 11/26/2016   CL 100 (L) 11/26/2016   CO2 30 11/26/2016   BUN 16 11/26/2016   CREATININE 0.99 11/26/2016   PROT 7.7 11/26/2016   ALBUMIN 4.6 11/26/2016   BILITOT 0.6 11/26/2016   ALKPHOS 67 11/26/2016  AST 16 11/26/2016   ALT 11 (L) 11/26/2016  .   Total Time in preparing paper work, data evaluation and todays exam - 35 minutes  Lyndall Windt M.D on 11/28/2016 at 3:25 PM  [facility]   Office  [phone]

## 2016-11-29 LAB — HEMOGLOBIN A1C
HEMOGLOBIN A1C: 6.7 % — AB (ref 4.8–5.6)
MEAN PLASMA GLUCOSE: 146 mg/dL

## 2016-11-29 LAB — VAS US CAROTID
LCCADDIAS: -17 cm/s
LCCAPDIAS: 15 cm/s
LEFT ECA DIAS: -8 cm/s
LEFT VERTEBRAL DIAS: 15 cm/s
LICADDIAS: -17 cm/s
LICADSYS: -55 cm/s
LICAPDIAS: -8 cm/s
Left CCA dist sys: -89 cm/s
Left CCA prox sys: 129 cm/s
Left ICA prox sys: -33 cm/s
RIGHT ECA DIAS: -9 cm/s
RIGHT VERTEBRAL DIAS: 11 cm/s
Right CCA prox dias: 11 cm/s
Right CCA prox sys: 94 cm/s
Right cca dist sys: -75 cm/s

## 2016-12-03 ENCOUNTER — Ambulatory Visit: Payer: Self-pay | Admitting: Family Medicine

## 2016-12-07 ENCOUNTER — Ambulatory Visit: Payer: Self-pay | Admitting: Family Medicine

## 2016-12-14 ENCOUNTER — Ambulatory Visit (INDEPENDENT_AMBULATORY_CARE_PROVIDER_SITE_OTHER): Payer: Self-pay | Admitting: Family Medicine

## 2016-12-14 ENCOUNTER — Encounter: Payer: Self-pay | Admitting: Family Medicine

## 2016-12-14 VITALS — BP 145/72 | HR 91 | Temp 97.8°F | Resp 12 | Ht 65.0 in | Wt 151.4 lb

## 2016-12-14 DIAGNOSIS — E118 Type 2 diabetes mellitus with unspecified complications: Secondary | ICD-10-CM

## 2016-12-14 DIAGNOSIS — I10 Essential (primary) hypertension: Secondary | ICD-10-CM

## 2016-12-14 DIAGNOSIS — G450 Vertebro-basilar artery syndrome: Secondary | ICD-10-CM

## 2016-12-14 LAB — POCT URINALYSIS DIP (DEVICE)
Bilirubin Urine: NEGATIVE
Glucose, UA: NEGATIVE mg/dL
HGB URINE DIPSTICK: NEGATIVE
KETONES UR: NEGATIVE mg/dL
Leukocytes, UA: NEGATIVE
NITRITE: NEGATIVE
PH: 6.5 (ref 5.0–8.0)
PROTEIN: NEGATIVE mg/dL
Specific Gravity, Urine: 1.015 (ref 1.005–1.030)
Urobilinogen, UA: 1 mg/dL (ref 0.0–1.0)

## 2016-12-14 MED ORDER — ASPIRIN EC 81 MG PO TBEC: 81.0000 mg | DELAYED_RELEASE_TABLET | Freq: Every day | ORAL | 0 refills | Status: AC

## 2016-12-14 MED ORDER — LOSARTAN POTASSIUM 100 MG PO TABS: 100.0000 mg | ORAL_TABLET | Freq: Every day | ORAL | 1 refills | Status: DC

## 2016-12-14 MED ORDER — METFORMIN HCL 1000 MG PO TABS: 1000.0000 mg | ORAL_TABLET | Freq: Two times a day (BID) | ORAL | 2 refills | Status: DC

## 2016-12-14 MED ORDER — ATORVASTATIN CALCIUM 20 MG PO TABS: 20.0000 mg | ORAL_TABLET | Freq: Every day | ORAL | 0 refills | Status: DC

## 2016-12-14 MED ORDER — AMLODIPINE BESYLATE 5 MG PO TABS: 5.0000 mg | ORAL_TABLET | Freq: Every day | ORAL | 3 refills | Status: DC

## 2016-12-14 NOTE — Patient Instructions (Signed)
Diabetes Discontinue glipizide. Continue metformin as prescribed.  Hypertension Discontinue lisinopril. Adding amlodipine 5 mg once daily at bedtime. Continue losartan 100 mg once daily.   Diabetes Mellitus and Food It is important for you to manage your blood sugar (glucose) level. Your blood glucose level can be greatly affected by what you eat. Eating healthier foods in the appropriate amounts throughout the day at about the same time each day will help you control your blood glucose level. It can also help slow or prevent worsening of your diabetes mellitus. Healthy eating may even help you improve the level of your blood pressure and reach or maintain a healthy weight. General recommendations for healthful eating and cooking habits include:  Eating meals and snacks regularly. Avoid going long periods of time without eating to lose weight.  Eating a diet that consists mainly of plant-based foods, such as fruits, vegetables, nuts, legumes, and whole grains.  Using low-heat cooking methods, such as baking, instead of high-heat cooking methods, such as deep frying.  Work with your dietitian to make sure you understand how to use the Nutrition Facts information on food labels. How can food affect me? Carbohydrates Carbohydrates affect your blood glucose level more than any other type of food. Your dietitian will help you determine how many carbohydrates to eat at each meal and teach you how to count carbohydrates. Counting carbohydrates is important to keep your blood glucose at a healthy level, especially if you are using insulin or taking certain medicines for diabetes mellitus. Alcohol Alcohol can cause sudden decreases in blood glucose (hypoglycemia), especially if you use insulin or take certain medicines for diabetes mellitus. Hypoglycemia can be a life-threatening condition. Symptoms of hypoglycemia (sleepiness, dizziness, and disorientation) are similar to symptoms of having too much  alcohol. If your health care provider has given you approval to drink alcohol, do so in moderation and use the following guidelines:  Women should not have more than one drink per day, and men should not have more than two drinks per day. One drink is equal to: ? 12 oz of beer. ? 5 oz of wine. ? 1 oz of hard liquor.  Do not drink on an empty stomach.  Keep yourself hydrated. Have water, diet soda, or unsweetened iced tea.  Regular soda, juice, and other mixers might contain a lot of carbohydrates and should be counted.  What foods are not recommended? As you make food choices, it is important to remember that all foods are not the same. Some foods have fewer nutrients per serving than other foods, even though they might have the same number of calories or carbohydrates. It is difficult to get your body what it needs when you eat foods with fewer nutrients. Examples of foods that you should avoid that are high in calories and carbohydrates but low in nutrients include:  Trans fats (most processed foods list trans fats on the Nutrition Facts label).  Regular soda.  Juice.  Candy.  Sweets, such as cake, pie, doughnuts, and cookies.  Fried foods.  What foods can I eat? Eat nutrient-rich foods, which will nourish your body and keep you healthy. The food you should eat also will depend on several factors, including:  The calories you need.  The medicines you take.  Your weight.  Your blood glucose level.  Your blood pressure level.  Your cholesterol level.  You should eat a variety of foods, including:  Protein. ? Lean cuts of meat. ? Proteins low in saturated fats, such  as fish, egg whites, and beans. Avoid processed meats.  Fruits and vegetables. ? Fruits and vegetables that may help control blood glucose levels, such as apples, mangoes, and yams.  Dairy products. ? Choose fat-free or low-fat dairy products, such as milk, yogurt, and cheese.  Grains, bread, pasta,  and rice. ? Choose whole grain products, such as multigrain bread, whole oats, and brown rice. These foods may help control blood pressure.  Fats. ? Foods containing healthful fats, such as nuts, avocado, olive oil, canola oil, and fish.  Does everyone with diabetes mellitus have the same meal plan? Because every person with diabetes mellitus is different, there is not one meal plan that works for everyone. It is very important that you meet with a dietitian who will help you create a meal plan that is just right for you. This information is not intended to replace advice given to you by your health care provider. Make sure you discuss any questions you have with your health care provider. Document Released: 02/15/2005 Document Revised: 10/27/2015 Document Reviewed: 04/17/2013 Elsevier Interactive Patient Education  2017 Elsevier Inc.   Hypertension Hypertension is another name for high blood pressure. High blood pressure forces your heart to work harder to pump blood. This can cause problems over time. There are two numbers in a blood pressure reading. There is a top number (systolic) over a bottom number (diastolic). It is best to have a blood pressure below 120/80. Healthy choices can help lower your blood pressure. You may need medicine to help lower your blood pressure if:  Your blood pressure cannot be lowered with healthy choices.  Your blood pressure is higher than 130/80.  Follow these instructions at home: Eating and drinking  If directed, follow the DASH eating plan. This diet includes: ? Filling half of your plate at each meal with fruits and vegetables. ? Filling one quarter of your plate at each meal with whole grains. Whole grains include whole wheat pasta, brown rice, and whole grain bread. ? Eating or drinking low-fat dairy products, such as skim milk or low-fat yogurt. ? Filling one quarter of your plate at each meal with low-fat (lean) proteins. Low-fat proteins  include fish, skinless chicken, eggs, beans, and tofu. ? Avoiding fatty meat, cured and processed meat, or chicken with skin. ? Avoiding premade or processed food.  Eat less than 1,500 mg of salt (sodium) a day.  Limit alcohol use to no more than 1 drink a day for nonpregnant women and 2 drinks a day for men. One drink equals 12 oz of beer, 5 oz of wine, or 1 oz of hard liquor. Lifestyle  Work with your doctor to stay at a healthy weight or to lose weight. Ask your doctor what the best weight is for you.  Get at least 30 minutes of exercise that causes your heart to beat faster (aerobic exercise) most days of the week. This may include walking, swimming, or biking.  Get at least 30 minutes of exercise that strengthens your muscles (resistance exercise) at least 3 days a week. This may include lifting weights or pilates.  Do not use any products that contain nicotine or tobacco. This includes cigarettes and e-cigarettes. If you need help quitting, ask your doctor.  Check your blood pressure at home as told by your doctor.  Keep all follow-up visits as told by your doctor. This is important. Medicines  Take over-the-counter and prescription medicines only as told by your doctor. Follow directions carefully.  Do  not skip doses of blood pressure medicine. The medicine does not work as well if you skip doses. Skipping doses also puts you at risk for problems.  Ask your doctor about side effects or reactions to medicines that you should watch for. Contact a doctor if:  You think you are having a reaction to the medicine you are taking.  You have headaches that keep coming back (recurring).  You feel dizzy.  You have swelling in your ankles.  You have trouble with your vision. Get help right away if:  You get a very bad headache.  You start to feel confused.  You feel weak or numb.  You feel faint.  You get very bad pain in your: ? Chest. ? Belly (abdomen).  You throw up  (vomit) more than once.  You have trouble breathing. Summary  Hypertension is another name for high blood pressure.  Making healthy choices can help lower blood pressure. If your blood pressure cannot be controlled with healthy choices, you may need to take medicine. This information is not intended to replace advice given to you by your health care provider. Make sure you discuss any questions you have with your health care provider. Document Released: 11/07/2007 Document Revised: 04/18/2016 Document Reviewed: 04/18/2016 Elsevier Interactive Patient Education  [employer].

## 2016-12-14 NOTE — Progress Notes (Signed)
Patient ID: Steven [name], male    DOB: 06/19/1950, 66 y.o.   MRN: [id number]  PCP: Bing NeighborsHarris, Bettyann Birchler S, FNP  Chief Complaint  Patient presents with  . Establish Care  . Hospitalization Follow-up    Subjective:  HPI-[name] patient is assisting with translation Steven [name] is a 66 y.o. male, minimal English-speaking , presents to establish care and for hospital follow-up. Medical history significant for: TIA, hypertension, and diabetes. Steven [name] [facility] department with confusion, dizziness, and weakness. Steven [name] is present at today's visit and confirms that the symptoms have been present lately over a few days prior to his ascending to the emergency department. 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 He was started on statin therapy with Lipitor and aspirin therapy and discharged to follow up here in clinic to establish with a PCP. Today he reports he continues to have some mild balance issues however [name] reports that he's been a little bit more confused and slowed motor movements lately.  Patient reports that he feels as if his balance is off and continues to experience some dizziness. Steven [name] confirms that she routinely checks his blood pressure and readings are typically systolically in the 130s and less than 90 diastolically. She does not check his blood pressure daily, periodically. Patient's [name] reports that his diabetes has been controlled well on oral medications metformin and glipizide. Most recent A1c 11/28/2016 was 6.7, which is at goal of less than 7. Social History   Social History  . Marital status: Married    Spouse name: N/A  . Number of children: N/A  . Years of education: N/A   Occupational History  . Not on file.   Social History Main Topics  . Smoking status: Never Smoker  . Smokeless tobacco: Never Used  . Alcohol use No  . Drug use: No  . Sexual activity: Not on  file   Other Topics Concern  . Not on file   Social History Narrative  . No narrative on file   History reviewed. No pertinent family history. Review of Systems See history of present illness Patient Active Problem List   Diagnosis Date Noted  . TIA (transient ischemic attack) 11/27/2016  . Essential hypertension 11/27/2016  . Diabetes (HCC) 11/27/2016  . Diabetes mellitus with complication (HCC)     No Known Allergies  Prior to Admission medications   Medication Sig Start Date End Date Taking? Authorizing Provider  acetaminophen (TYLENOL) 500 MG tablet Take 1,000 mg by mouth every 6 (six) hours as needed for moderate pain or headache.   Yes [provider]  aspirin EC 81 MG tablet Take 1 tablet (81 mg total) by mouth daily. 11/28/16 12/28/16 Yes Elgergawy, Leana Roeawood S, MD  atorvastatin (LIPITOR) 20 MG tablet Take 1 tablet (20 mg total) by mouth daily at 6 PM. 11/28/16  Yes Elgergawy, Leana Roeawood S, MD  glipiZIDE (GLUCOTROL) 5 MG tablet Take 5 mg by mouth daily before breakfast.   Yes [provider]  lisinopril (PRINIVIL,ZESTRIL) 40 MG tablet Take 40 mg by mouth daily.   Yes [provider]  metFORMIN (GLUCOPHAGE) 1000 MG tablet Take 1,000 mg by mouth 2 (two) times daily with a meal.   Yes [provider]  losartan (COZAAR) 100 MG tablet Take 100 mg by mouth daily.    [provider]    Past Medical, Surgical Family and Social History reviewed and updated.    Objective:   Today's Vitals  12/14/16 0824  BP: (!) 145/72  Pulse: 91  Resp: 12  Temp: 97.8 F (36.6 C)  TempSrc: Oral  SpO2: 100%  Weight: 151 lb 6.4 oz (68.7 kg)  Height: 5\' 5"  (1.651 m)    Wt Readings from Last 3 Encounters:  12/14/16 151 lb 6.4 oz (68.7 kg)  11/27/16 149 lb 9.6 oz (67.9 kg)    Physical Exam  Constitutional: He is oriented to person, place, and time. He appears well-developed and well-nourished.  HENT:  Head: Normocephalic and atraumatic.  Eyes:  Pupils are equal, round, and reactive to light. Conjunctivae and EOM are normal. No scleral icterus.  Neck: Normal range of motion. Neck supple. No thyromegaly present.  Cardiovascular: Normal rate, normal heart sounds and intact distal pulses.   Pulmonary/Chest: Effort normal and breath sounds normal.  Musculoskeletal: Normal range of motion.  Lymphadenopathy:    He has no cervical adenopathy.  Neurological: He is alert and oriented to person, place, and time. He has normal strength. He displays no tremor. Gait abnormal. Coordination normal. GCS eye subscore is 4. GCS verbal subscore is 5. GCS motor subscore is 6.  Skin: Skin is warm and dry.  Psychiatric: He has a normal mood and affect. His behavior is normal. Judgment and thought content normal.   Assessment & Plan:  1. Vertebrobasilar artery syndrome -Ambulatory  referral to Physical Therapy - Ambulatory referral to Neurology  2. Essential hypertension -I am adding amlodipine 5 mg daily -Continue losartan 100 mg once daily -I'm discontinuing lisinopril 40 mg as patient was discharged on an Ace/ARB.  3.Diabetes mellitus with complications -Hemoglobin A1c today is 6.7, well controlled. -Continue current regimen with no changes today   [place] patient assistance application given today at visit.  RTC: 6 weeks for routine follow-up.   Godfrey Pick. Tiburcio Pea, MSN, FNP-C [facility]  [address] [place], [place] [address] [phone]

## 2016-12-19 ENCOUNTER — Other Ambulatory Visit: Payer: Self-pay | Admitting: Family Medicine

## 2016-12-19 DIAGNOSIS — G450 Vertebro-basilar artery syndrome: Secondary | ICD-10-CM

## 2016-12-19 DIAGNOSIS — Z9189 Other specified personal risk factors, not elsewhere classified: Secondary | ICD-10-CM

## 2016-12-19 NOTE — Progress Notes (Signed)
Future hepatic panel, cbc, and cmp pending in epic

## 2016-12-28 MED FILL — AMLODIPINE BESYLATE 5 MG TA: 5 | 30 days supply | Qty: 30 | Fill #0

## 2016-12-28 MED FILL — ?ATORVASTATIN 20 MG TABLET: 20 | 30 days supply | Qty: 30 | Fill #0

## 2016-12-28 MED FILL — ?METFORMIN HCL 1,000 MG TAB: 1000 | 30 days supply | Qty: 60 | Fill #0

## 2016-12-28 MED FILL — LOSARTAN POTASSIUM 100 MG T: 100 | 30 days supply | Qty: 30 | Fill #0

## 2017-01-02 ENCOUNTER — Other Ambulatory Visit (INDEPENDENT_AMBULATORY_CARE_PROVIDER_SITE_OTHER): Payer: Self-pay

## 2017-01-02 DIAGNOSIS — G450 Vertebro-basilar artery syndrome: Secondary | ICD-10-CM

## 2017-01-02 DIAGNOSIS — Z9189 Other specified personal risk factors, not elsewhere classified: Secondary | ICD-10-CM

## 2017-01-02 LAB — CBC WITH DIFFERENTIAL/PLATELET
BASOS PCT: 0 %
Basophils Absolute: 0 cells/uL (ref 0–200)
EOS PCT: 2 %
Eosinophils Absolute: 112 cells/uL (ref 15–500)
HCT: 41.1 % (ref 38.5–50.0)
Hemoglobin: 13.5 g/dL (ref 13.2–17.1)
Lymphocytes Relative: 43 %
Lymphs Abs: 2408 cells/uL (ref 850–3900)
MCH: 25.9 pg — ABNORMAL LOW (ref 27.0–33.0)
MCHC: 32.8 g/dL (ref 32.0–36.0)
MCV: 78.9 fL — ABNORMAL LOW (ref 80.0–100.0)
MONOS PCT: 8 %
MPV: 9.2 fL (ref 7.5–12.5)
Monocytes Absolute: 448 cells/uL (ref 200–950)
NEUTROS ABS: 2632 {cells}/uL (ref 1500–7800)
Neutrophils Relative %: 47 %
PLATELETS: 258 10*3/uL (ref 140–400)
RBC: 5.21 MIL/uL (ref 4.20–5.80)
RDW: 14.3 % (ref 11.0–15.0)
WBC: 5.6 10*3/uL (ref 3.8–10.8)

## 2017-01-03 ENCOUNTER — Other Ambulatory Visit: Payer: Self-pay

## 2017-01-03 LAB — COMPLETE METABOLIC PANEL WITH GFR
ALBUMIN: 4.4 g/dL (ref 3.6–5.1)
ALK PHOS: 70 U/L (ref 40–115)
ALT: 7 U/L — ABNORMAL LOW (ref 9–46)
AST: 9 U/L — ABNORMAL LOW (ref 10–35)
BILIRUBIN TOTAL: 0.4 mg/dL (ref 0.2–1.2)
BUN: 11 mg/dL (ref 7–25)
CO2: 24 mmol/L (ref 20–31)
Calcium: 10 mg/dL (ref 8.6–10.3)
Chloride: 106 mmol/L (ref 98–110)
Creat: 1.01 mg/dL (ref 0.70–1.25)
GFR, EST AFRICAN AMERICAN: 89 mL/min (ref >=60)
GFR, EST NON AFRICAN AMERICAN: 77 mL/min (ref >=60)
Glucose, Bld: 103 mg/dL — ABNORMAL HIGH (ref 65–99)
POTASSIUM: 4.5 mmol/L (ref 3.5–5.3)
Sodium: 144 mmol/L (ref 135–146)
TOTAL PROTEIN: 7.1 g/dL (ref 6.1–8.1)

## 2017-01-03 LAB — HEPATIC FUNCTION PANEL
ALBUMIN: 4.4 g/dL (ref 3.6–5.1)
ALK PHOS: 70 U/L (ref 40–115)
ALT: 7 U/L — AB (ref 9–46)
AST: 9 U/L — AB (ref 10–35)
BILIRUBIN TOTAL: 0.4 mg/dL (ref 0.2–1.2)
Bilirubin, Direct: 0.1 mg/dL (ref <=0.2)
Indirect Bilirubin: 0.3 mg/dL (ref 0.2–1.2)
Total Protein: 7.1 g/dL (ref 6.1–8.1)

## 2017-01-04 ENCOUNTER — Other Ambulatory Visit: Payer: Self-pay

## 2017-01-30 MED FILL — LOSARTAN POTASSIUM 100 MG T: 100 | 30 days supply | Qty: 30 | Fill #1

## 2017-01-30 MED FILL — AMLODIPINE BESYLATE 5 MG TA: 5 | 30 days supply | Qty: 30 | Fill #1

## 2017-01-30 MED FILL — ?ATORVASTATIN 20 MG TABLET: 20 | 30 days supply | Qty: 30 | Fill #1

## 2017-01-30 MED FILL — ?METFORMIN HCL 1,000 MG TAB: 1000 | 30 days supply | Qty: 60 | Fill #1

## 2017-02-14 ENCOUNTER — Ambulatory Visit: Payer: Self-pay | Admitting: Family Medicine

## 2017-03-06 MED FILL — AMLODIPINE BESYLATE 5 MG TA: 5 | 30 days supply | Qty: 30 | Fill #2

## 2017-03-06 MED FILL — LOSARTAN POTASSIUM 100 MG T: 100 | 30 days supply | Qty: 30 | Fill #2

## 2017-03-06 MED FILL — ?ATORVASTATIN 20 MG TABLET: 20 | 30 days supply | Qty: 30 | Fill #2

## 2017-03-06 MED FILL — ?METFORMIN HCL 1,000 MG TAB: 1000 | 30 days supply | Qty: 60 | Fill #2

## 2017-03-28 ENCOUNTER — Encounter: Payer: Self-pay | Admitting: Family Medicine

## 2017-03-28 ENCOUNTER — Ambulatory Visit (INDEPENDENT_AMBULATORY_CARE_PROVIDER_SITE_OTHER): Payer: Self-pay | Admitting: Family Medicine

## 2017-03-28 VITALS — BP 136/70 | HR 82 | Temp 97.8°F | Resp 14 | Ht 65.0 in | Wt 147.0 lb

## 2017-03-28 DIAGNOSIS — I10 Essential (primary) hypertension: Secondary | ICD-10-CM

## 2017-03-28 DIAGNOSIS — G450 Vertebro-basilar artery syndrome: Secondary | ICD-10-CM

## 2017-03-28 DIAGNOSIS — G459 Transient cerebral ischemic attack, unspecified: Secondary | ICD-10-CM

## 2017-03-28 DIAGNOSIS — E118 Type 2 diabetes mellitus with unspecified complications: Secondary | ICD-10-CM

## 2017-03-28 DIAGNOSIS — Z23 Encounter for immunization: Secondary | ICD-10-CM

## 2017-03-28 LAB — COMPLETE METABOLIC PANEL WITH GFR
AG RATIO: 1.4 (calc) (ref 1.0–2.5)
ALKALINE PHOSPHATASE (APISO): 72 U/L (ref 40–115)
ALT: 10 U/L (ref 9–46)
AST: 11 U/L (ref 10–35)
Albumin: 4.4 g/dL (ref 3.6–5.1)
BILIRUBIN TOTAL: 0.5 mg/dL (ref 0.2–1.2)
BUN: 14 mg/dL (ref 7–25)
CHLORIDE: 102 mmol/L (ref 98–110)
CO2: 29 mmol/L (ref 20–32)
Calcium: 10.1 mg/dL (ref 8.6–10.3)
Creat: 0.97 mg/dL (ref 0.70–1.25)
GFR, EST AFRICAN AMERICAN: 94 mL/min/{1.73_m2} (ref >=60)
GFR, Est Non African American: 81 mL/min/{1.73_m2} (ref >=60)
GLUCOSE: 218 mg/dL — AB (ref 65–99)
Globulin: 3.2 g/dL (calc) (ref 1.9–3.7)
POTASSIUM: 4.2 mmol/L (ref 3.5–5.3)
Sodium: 139 mmol/L (ref 135–146)
TOTAL PROTEIN: 7.6 g/dL (ref 6.1–8.1)

## 2017-03-28 LAB — POCT URINALYSIS DIP (DEVICE)
BILIRUBIN URINE: NEGATIVE
HGB URINE DIPSTICK: NEGATIVE
Ketones, ur: NEGATIVE mg/dL
LEUKOCYTES UA: NEGATIVE
NITRITE: NEGATIVE
Protein, ur: NEGATIVE mg/dL
Specific Gravity, Urine: 1.02 (ref 1.005–1.030)
UROBILINOGEN UA: 0.2 mg/dL (ref 0.0–1.0)
pH: 5 (ref 5.0–8.0)

## 2017-03-28 LAB — POCT GLYCOSYLATED HEMOGLOBIN (HGB A1C): HEMOGLOBIN A1C: 6.9

## 2017-03-28 MED ORDER — LOSARTAN POTASSIUM 100 MG PO TABS: 100.0000 mg | ORAL_TABLET | Freq: Every day | ORAL | 1 refills | Status: AC

## 2017-03-28 MED ORDER — AMLODIPINE BESYLATE 5 MG PO TABS: 5.0000 mg | ORAL_TABLET | Freq: Every day | ORAL | 3 refills | Status: AC

## 2017-03-28 MED ORDER — ATORVASTATIN CALCIUM 20 MG PO TABS: 20.0000 mg | ORAL_TABLET | Freq: Every day | ORAL | 1 refills | Status: AC

## 2017-03-28 MED ORDER — METFORMIN HCL 1000 MG PO TABS: 1000.0000 mg | ORAL_TABLET | Freq: Two times a day (BID) | ORAL | 2 refills | Status: AC

## 2017-03-28 NOTE — Patient Instructions (Addendum)
Your blood pressure is at goal on current medication regimen, no changes warranted.  I will resend a referral to physical therapy due to residual deficits. Also, sent referral to opthalmology for a diabetic eye exam.  Provided written copy of carbohydrate modified diet.  Recommend adjusting furniture and rugs in home to prevent falls.   Please follow up with Steven [name] in 3 months for evaluation of chronic conditions

## 2017-03-28 NOTE — Progress Notes (Signed)
Subjective:    Patient ID: Steven [name], male    DOB: 01-02-1951, 66 y.o.   MRN: [id number]  HPI  Thelbert Gartin, a 65 year old male with a history of vertebrobasilar artery syndrome, DMII,  and hypertension presents for a follow up of chronic conditions. Patient primarily speaks [place] and is accompanied by daughter to assist with communication. He has been adhering to medication regimen as prescribed. His daughter prepares meals and ensures that patient eats a balanced diet. Blood pressure is not checked at home.   Patient denies chest pain, dyspnea, irregular heart beat, palpitations and tachypnea.  Cardiovascular risk factors include: dyslipidemia and hypertension.    Patient continues to have issues with balance and gross motor movements. Mr. Georgina Quint has difficulty transitioning from sitting to standing. He also endorses gait abnormality.  Patient was referred to physical therapy during appoitnment to establish care. Patient was unable to follow with physical therapy at that time. He has been approved for financial assistance and is requesting a new referral to physical therapy.   Past Medical History:  Diagnosis Date  . Diabetes mellitus without complication (HCC)   . Hypertension     There is no immunization history on file for this patient.  Review of Systems  Constitutional: Positive for fatigue.  HENT: Negative.   Eyes: Negative.   Respiratory: Negative.   Cardiovascular: Negative.  Negative for chest pain, palpitations and leg swelling.  Gastrointestinal: Negative.   Endocrine: Negative.  Negative for polydipsia and polyphagia.  Genitourinary: Negative.   Musculoskeletal: Positive for gait problem.  Allergic/Immunologic: Negative.   Neurological: Positive for weakness. Negative for dizziness.  Hematological: Negative.   Psychiatric/Behavioral: Negative.        Objective:   Physical Exam  Constitutional: He is oriented to person, place, and time.  HENT:  Head:  Normocephalic and atraumatic.  Right Ear: External ear normal.  Left Ear: External ear normal.  Nose: Nose normal.  Mouth/Throat: Oropharynx is clear and moist.  Eyes: Pupils are equal, round, and reactive to light. Conjunctivae are normal.  Neck: Normal range of motion. Neck supple.  Cardiovascular: Normal rate, regular rhythm, normal heart sounds and intact distal pulses.   Pulmonary/Chest: Effort normal.  Abdominal: Soft. Bowel sounds are normal.  Musculoskeletal: Normal range of motion.  3/5 Weakness to upper extremities  Neurological: He is alert and oriented to person, place, and time. He has normal reflexes. He displays no tremor. Coordination and gait abnormal.  Skin: Skin is warm and dry.  Psychiatric: He has a normal mood and affect. His behavior is normal. Judgment and thought content normal.      BP 136/70 (BP Location: Left Arm, Patient Position: Sitting, Cuff Size: Small)   Pulse 82   Temp 97.8 F (36.6 C) (Oral)   Resp 14   Ht 5\' 5"  (1.651 m)   Wt 147 lb (66.7 kg)   SpO2 100%   BMI 24.46 kg/m  Assessment & Plan:  1. Vertebrobasilar artery syndrome Continue ASA and low intensity statin therapy.  Patient continues to have difficulty with balance and weakness. He will benefit from physical therapy.  Patient is a high fall risk. Discussed the importance of reorganizing furniture and moving rugs to further reduce falls in the home.   Patient is requesting 3 month supply of medications. He will be traveling to [place] in 3-4 weeks for the holidays.   - Ambulatory referral to Physical Therapy  - aspirin 81 MG chewable tablet; Chew by mouth daily. -  atorvastatin (LIPITOR) 20 MG tablet; Take 1 tablet (20 mg total) by mouth daily at 6 PM.  Dispense: 90 tablet; Refill: 1 - losartan (COZAAR) 100 MG tablet; Take 1 tablet (100 mg total) by mouth daily.  Dispense: 90 tablet; Refill: 1  2. Essential hypertension Blood pressure is at goal on current medication regimen No  proteinuria present on urinalysis Renal functioning is within a normal range.  - COMPLETE METABOLIC PANEL WITH GFR - amLODipine (NORVASC) 5 MG tablet; Take 1 tablet (5 mg total) by mouth daily.  Dispense: 90 tablet; Refill: 3 - losartan (COZAAR) 100 MG tablet; Take 1 tablet (100 mg total) by mouth daily.  Dispense: 90 tablet; Refill: 1 - POCT urinalysis dip (device)  3. Diabetes mellitus with complication (HCC) Reviewed urinalysis, glucosuria present.  Current hemoglobin a1C is 6.9, which continues to be at goal. Will continue Metformin 1000 mg BID. Discussed the importance of adherence to a carbohydrate modified diet. Gave daughter a written example of a carbohydrate modified diet.  - COMPLETE METABOLIC PANEL WITH GFR - HgB [id number] - metFORMIN (GLUCOPHAGE) 1000 MG tablet; Take 1 tablet (1,000 mg total) by mouth 2 (two) times daily with a meal.  Dispense: 180 tablet; Refill: 2 - Ambulatory referral to Ophthalmology  4. Immunization due  - Pneumococcal conjugate vaccine 13-valent   Health maintenance: Patient refused influenza vaccination Recommend colonoscopy after returning from [place] Recommend follow up for prostate exam and PSA     RTC: Follow up in 3 months with Joaquin CourtsKimberly Harris, FNP   Nolon NationsLaChina Moore Ileana Chalupa  MSN, FNP-C [facility] [address]  [place], [place] [id number] [phone]   The patient was given clear instructions to go to ER or return to medical center if symptoms do not improve, worsen or new problems develop. The patient verbalized understanding. Will notify patient with laboratory results.

## 2017-04-08 MED FILL — ATORVASTATIN 20 MG TABLET: 20 | 30 days supply | Qty: 30 | Fill #0

## 2017-04-08 MED FILL — LOSARTAN POTASSIUM 100 MG T: 100 | 30 days supply | Qty: 30 | Fill #0

## 2017-04-08 MED FILL — AMLODIPINE BESYLATE 5 MG TA: 5 | 30 days supply | Qty: 30 | Fill #0

## 2017-04-08 MED FILL — metFORMIN HCL 1000 MG TABS: 1000 | 30 days supply | Qty: 60 | Fill #0

## 2018-02-23 IMAGING — MR MR MRA HEAD W/O CM
9 of 11 series · 31 of 48 positions shown · non-contrast
Comparison: Comparison made with prior CT from earlier the same
day.

CLINICAL DATA: Initial evaluation for for balance, dizziness,
nausea, vomiting.

EXAM:
MRI HEAD WITHOUT CONTRAST
MRA HEAD WITHOUT CONTRAST
TECHNIQUE: Multiplanar, multiecho pulse sequences of the brain and surrounding
structures were obtained without intravenous contrast. Angiographic
images of the head were obtained using MRA technique without
contrast.

[Series 3: DWI · axial · 3.0mm · 1.09mm/px · z∈[-64,+80]mm · 7 of 98 slices shown (1 of 4)]
[im 1/98]
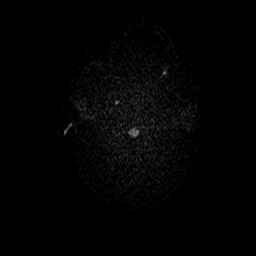
[im 17/98]
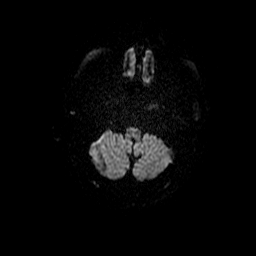
[im 33/98]
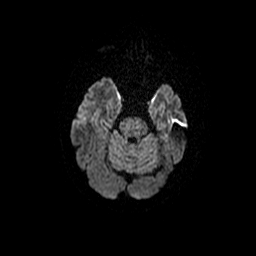
[im 49/98]
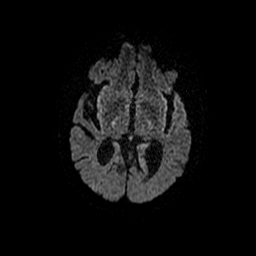
[im 65/98]
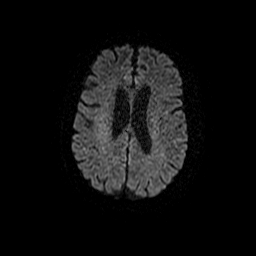
[im 81/98]
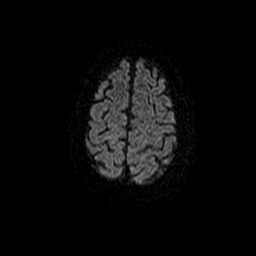
[im 98/98]
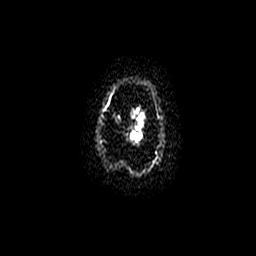

[Series 4: T1 · sagittal · 5.0mm · 0.47mm/px · 1 of 23 slices shown]
[im 1/23]
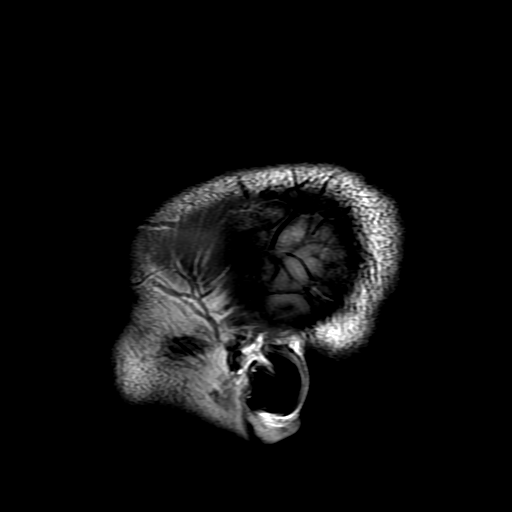

[Series 5: DWI · coronal · 5.0mm · 1.09mm/px · 6 of 72 slices shown (2 of 4)]
[im 1/72]
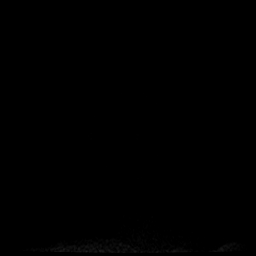
[im 15/72]
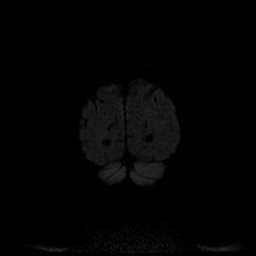
[im 29/72]
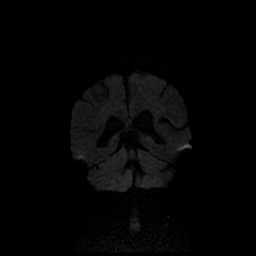
[im 43/72]
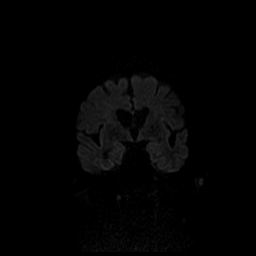
[im 57/72]
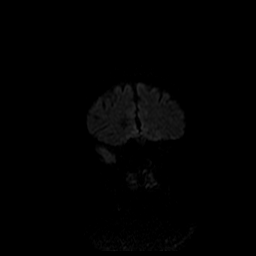
[im 72/72]
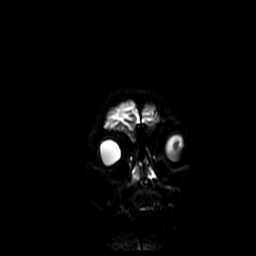

[Series 6: T2 · axial · 5.0mm · 0.47mm/px · z∈[-45,+92]mm · 2 of 24 slices shown (1 of 2)]
[im 1/24]
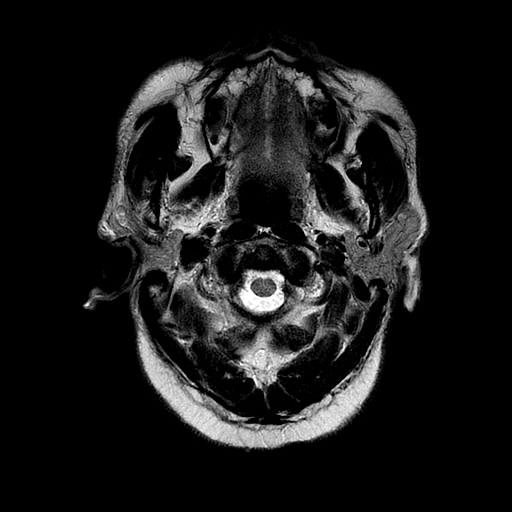
[im 24/24]
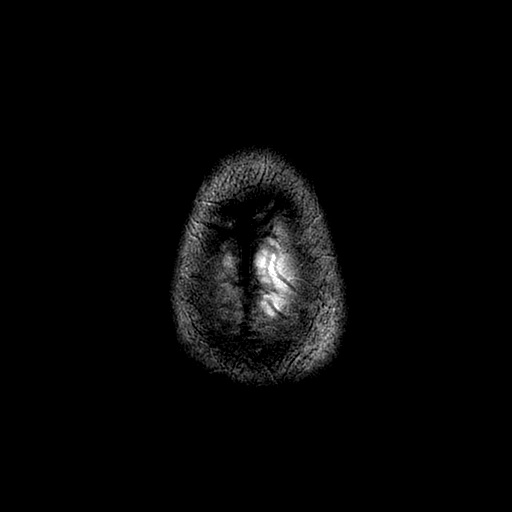

[Series 7: FLAIR · axial · 5.0mm · 0.43mm/px · z∈[-46,+91]mm · 2 of 24 slices shown]
[im 1/24]
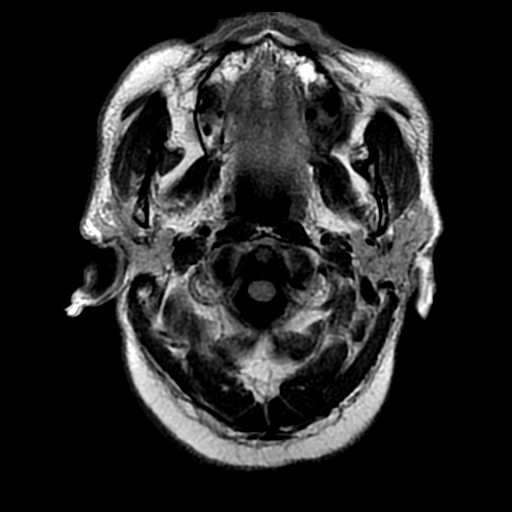
[im 24/24]
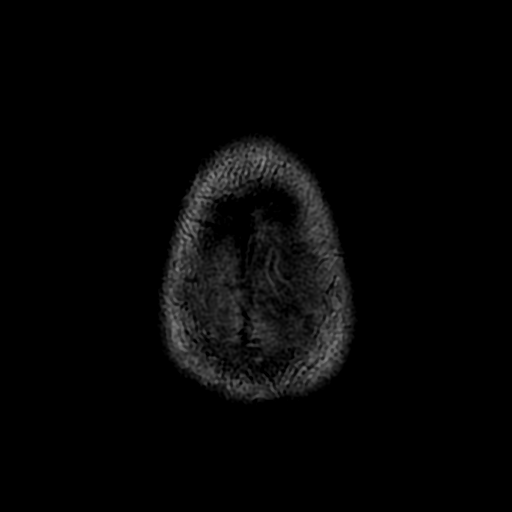

[Series 8: (id) mt fs · axial · 1.4mm · 0.43mm/px · z∈[-57,-19]mm · 4 of 136 slices shown]
[im 1/136]
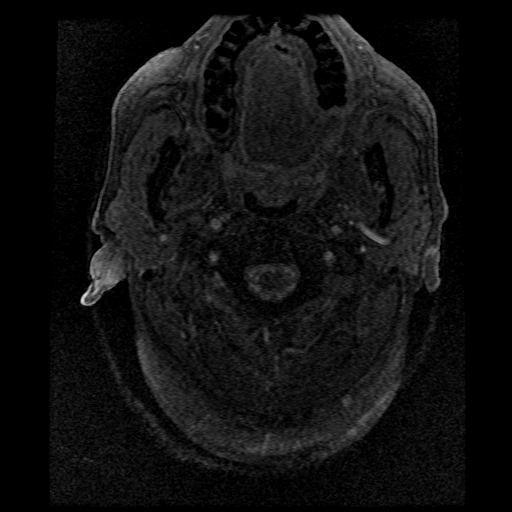
[im 28/136]
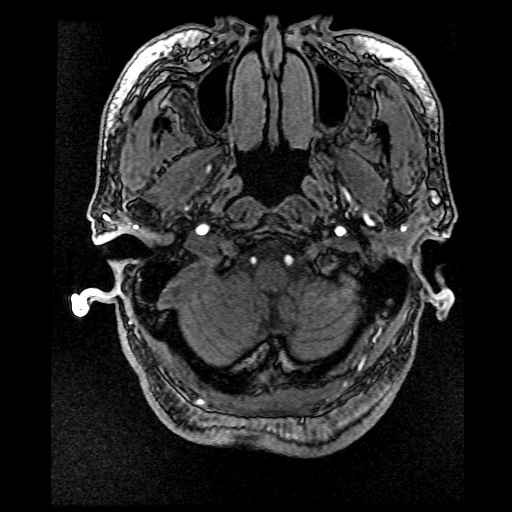
[im 41/136]
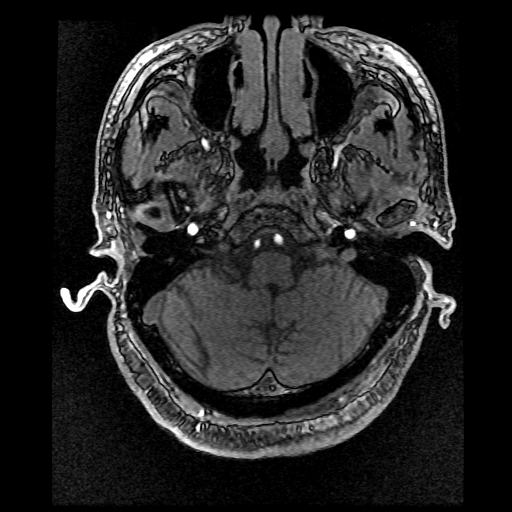
[im 55/136]
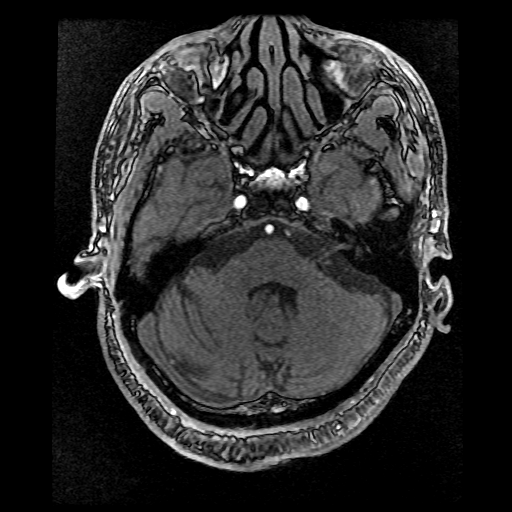

[Series 9: T2 · coronal · 5.0mm · 0.43mm/px · 2 of 30 slices shown (2 of 2)]
[im 1/30]
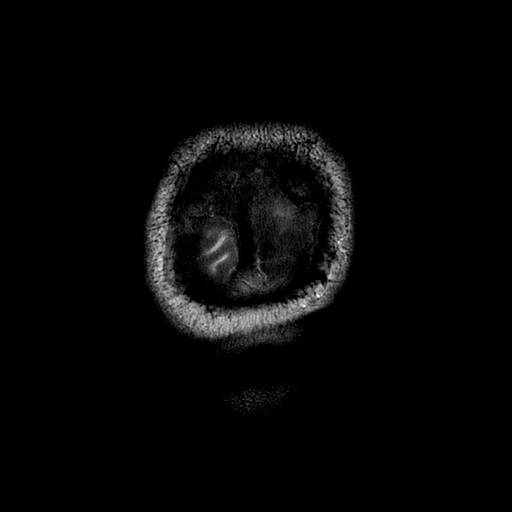
[im 30/30]
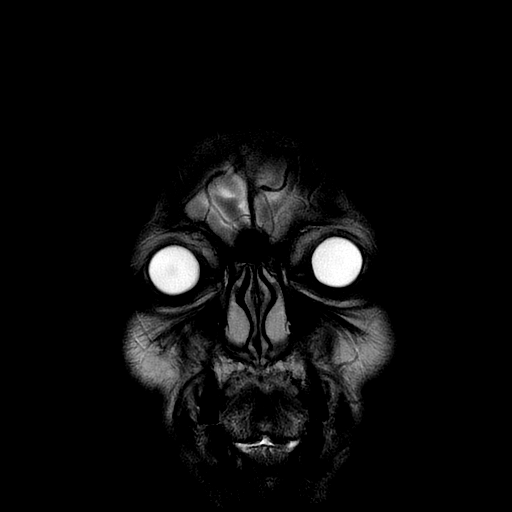

[Series 300: DWI · axial · 3.0mm · 1.09mm/px · z∈[-64,+80]mm · 4 of 49 slices shown (3 of 4)]
[im 1/49]
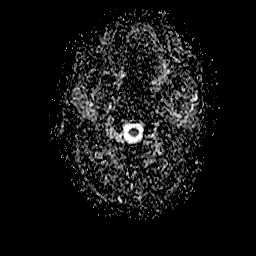
[im 17/49]
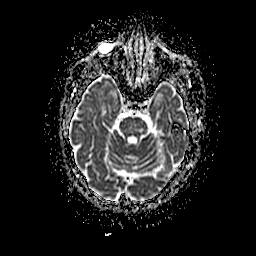
[im 33/49]
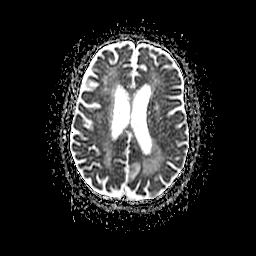
[im 49/49]
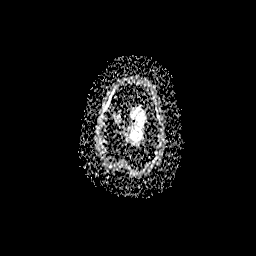

[Series 500: DWI · coronal · 5.0mm · 1.09mm/px · 3 of 36 slices shown (4 of 4)]
[im 1/36]
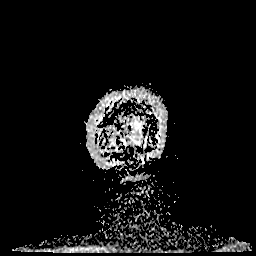
[im 18/36]
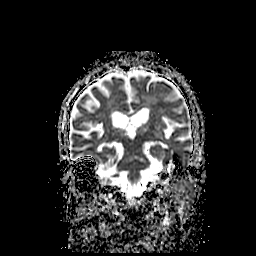
[im 36/36]
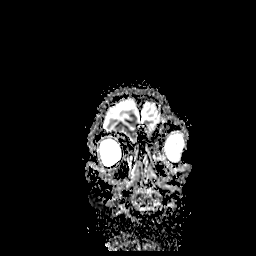

[31 of 48 positions shown; findings below may reference images not displayed]

FINDINGS: MRI HEAD FINDINGS

Brain: Diffuse prominence of the CSF containing spaces is compatible
with generalized cerebral atrophy, moderate nature. Patchy and
confluent T2/FLAIR hyperintensity within the periventricular and
deep white matter both cerebral hemispheres most consistent with
chronic small vessel ischemic disease, fairly advanced in nature.
Scattered superimposed remote lacunar infarcts present within the
bilateral basal ganglia and thalami as well as the pons.

No abnormal foci of restricted diffusion to suggest acute or
subacute ischemia. Gray-white matter differentiation maintained. No
other areas of remote cortical infarction. No evidence for acute
intracranial hemorrhage. Few scattered subcentimeter foci of
susceptibility artifact seen throughout the supratentorial and
infratentorial brain, most consistent with small chronic micro
hemorrhages, most likely hypertensive in etiology.

No mass lesion, midline shift or mass effect. Diffuse ventricular
prominence related to global parenchymal volume loss without
hydrocephalus. No extra-axial fluid collection. Major dural sinuses
are grossly patent.

Pituitary gland suprasellar region within normal limits. Midline
structures intact and normal.

Vascular: Major intracranial vascular flow voids are maintained.

Skull and upper cervical spine: Craniocervical junction within
normal limits. Visualized upper cervical spine unremarkable. Bone
marrow signal intensity within normal limits. No scalp soft tissue
abnormality.

Sinuses/Orbits: Globes and orbital soft tissues within normal
limits. Paranasal sinuses are largely clear. No mastoid effusion.
Inner ear structures normal.

MRA HEAD FINDINGS

ANTERIOR CIRCULATION:

Distal cervical segments of the internal carotid arteries are widely
patent with antegrade flow. Petrous, cavernous, and supraclinoid
segments widely patent bilaterally without flow-limiting stenosis.
ICA termini widely patent. 4 mm focal outpouching arising from the
supraclinoid left ICA, suspicious for possible small aneurysm
(series 8, image 85). This is directed medially and slightly
posteriorly.

A1 segments widely patent. Anterior communicating artery normal.
Anterior cerebral artery is widely patent to their distal aspects.

M1 segments widely patent without stenosis or occlusion. MCA
bifurcations normal. No proximal M2 occlusion. Distal MCA branches
well opacified and symmetric.

POSTERIOR CIRCULATION:

Vertebral artery is widely patent to the vertebrobasilar junction.
Left vertebral artery dominant. Basilar artery widely patent to its
distal aspect. Superior cerebral arteries patent bilaterally. Both
of the posterior cerebral arteries primarily supplied via the
basilar artery and are widely patent to their distal aspects. Small
bilateral posterior communicating arteries noted.
IMPRESSION: MRI HEAD IMPRESSION:

1. No acute intracranial infarct or other process identified.
2. Moderate cerebral atrophy with advanced chronic microvascular
ischemic disease.
3. Few scattered subcentimeter chronic micro hemorrhages involving
the supratentorial and infratentorial brain, most likely related to
chronic underlying hypertension.

MRA HEAD IMPRESSION:

1. Negative intracranial MRA. No large or proximal arterial branch
occlusion. No high-grade or correctable stenosis. No significant
atheromatous changes for patient age.
2. 4 mm focal outpouching arising from the cavernous left ICA,
suspicious for small aneurysm.
